# Patient Record
Sex: Male | Born: 1955 | Race: Black or African American | Hispanic: No | Marital: Married | Smoking: Never smoker
Health system: Southern US, Community
[De-identification: ages and names within clinical notes are randomized; demographics above are authoritative.]

## PROBLEM LIST (undated history)

## (undated) DIAGNOSIS — E7800 Pure hypercholesterolemia, unspecified: Secondary | ICD-10-CM

## (undated) DIAGNOSIS — H409 Unspecified glaucoma: Secondary | ICD-10-CM

## (undated) DIAGNOSIS — N186 End stage renal disease: Secondary | ICD-10-CM

## (undated) DIAGNOSIS — I10 Essential (primary) hypertension: Secondary | ICD-10-CM

## (undated) DIAGNOSIS — E119 Type 2 diabetes mellitus without complications: Secondary | ICD-10-CM

## (undated) DIAGNOSIS — D649 Anemia, unspecified: Secondary | ICD-10-CM

## (undated) HISTORY — PX: APPENDECTOMY: SHX54

## (undated) HISTORY — DX: Unspecified glaucoma: H40.9

## (undated) HISTORY — DX: Essential (primary) hypertension: I10

## (undated) HISTORY — DX: Anemia, unspecified: D64.9

## (undated) HISTORY — DX: Type 2 diabetes mellitus without complications: E11.9

## (undated) HISTORY — PX: EYE SURGERY: SHX253

## (undated) HISTORY — PX: PROSTATE BIOPSY: SHX241

---

## 2000-07-11 ENCOUNTER — Emergency Department (HOSPITAL_COMMUNITY): Admission: EM | Admit: 2000-07-11 | Discharge: 2000-07-11 | Payer: Self-pay | Admitting: Emergency Medicine

## 2001-06-20 ENCOUNTER — Emergency Department (HOSPITAL_COMMUNITY): Admission: EM | Admit: 2001-06-20 | Discharge: 2001-06-20 | Payer: Self-pay

## 2002-03-25 ENCOUNTER — Emergency Department (HOSPITAL_COMMUNITY): Admission: EM | Admit: 2002-03-25 | Discharge: 2002-03-25 | Payer: Self-pay

## 2003-04-10 ENCOUNTER — Encounter: Admission: RE | Admit: 2003-04-10 | Discharge: 2003-04-10 | Payer: Self-pay | Admitting: Internal Medicine

## 2003-04-17 ENCOUNTER — Encounter: Admission: RE | Admit: 2003-04-17 | Discharge: 2003-04-17 | Payer: Self-pay | Admitting: Internal Medicine

## 2003-04-24 ENCOUNTER — Encounter: Admission: RE | Admit: 2003-04-24 | Discharge: 2003-07-23 | Payer: Self-pay | Admitting: Internal Medicine

## 2003-11-16 ENCOUNTER — Encounter: Admission: RE | Admit: 2003-11-16 | Discharge: 2003-11-16 | Payer: Self-pay | Admitting: Internal Medicine

## 2005-01-06 ENCOUNTER — Ambulatory Visit: Payer: Self-pay | Admitting: Internal Medicine

## 2005-01-13 ENCOUNTER — Ambulatory Visit: Payer: Self-pay | Admitting: Internal Medicine

## 2005-03-01 ENCOUNTER — Ambulatory Visit: Payer: Self-pay | Admitting: Internal Medicine

## 2005-03-07 ENCOUNTER — Ambulatory Visit: Payer: Self-pay | Admitting: *Deleted

## 2005-06-07 ENCOUNTER — Ambulatory Visit: Payer: Self-pay | Admitting: Internal Medicine

## 2005-06-26 ENCOUNTER — Ambulatory Visit: Payer: Self-pay | Admitting: Internal Medicine

## 2005-12-06 ENCOUNTER — Ambulatory Visit: Payer: Self-pay | Admitting: Internal Medicine

## 2005-12-21 ENCOUNTER — Ambulatory Visit: Payer: Self-pay | Admitting: Internal Medicine

## 2006-01-09 ENCOUNTER — Emergency Department (HOSPITAL_COMMUNITY): Admission: EM | Admit: 2006-01-09 | Discharge: 2006-01-09 | Payer: Self-pay | Admitting: Emergency Medicine

## 2006-01-25 ENCOUNTER — Emergency Department (HOSPITAL_COMMUNITY): Admission: EM | Admit: 2006-01-25 | Discharge: 2006-01-25 | Payer: Self-pay | Admitting: Emergency Medicine

## 2006-01-26 ENCOUNTER — Ambulatory Visit: Payer: Self-pay | Admitting: Family Medicine

## 2006-01-29 ENCOUNTER — Ambulatory Visit: Payer: Self-pay | Admitting: Internal Medicine

## 2006-05-20 ENCOUNTER — Emergency Department (HOSPITAL_COMMUNITY): Admission: EM | Admit: 2006-05-20 | Discharge: 2006-05-20 | Payer: Self-pay | Admitting: Emergency Medicine

## 2006-05-29 LAB — CONVERTED CEMR LAB
Microalb Creat Ratio: 143.88 mg/g
Microalb, Ur: 14.1 mg/dL

## 2006-07-01 ENCOUNTER — Emergency Department (HOSPITAL_COMMUNITY): Admission: EM | Admit: 2006-07-01 | Discharge: 2006-07-01 | Payer: Self-pay | Admitting: Family Medicine

## 2006-08-07 LAB — CONVERTED CEMR LAB
HDL: 40 mg/dL
Triglycerides: 93 mg/dL

## 2007-03-15 LAB — CONVERTED CEMR LAB
Anion Gap: 12.7
BUN: 30 mg/dL
CO2: 28 meq/L
Creatinine, Ser: 1.3 mg/dL
Potassium: 4.7 meq/L

## 2007-05-20 DIAGNOSIS — IMO0001 Reserved for inherently not codable concepts without codable children: Secondary | ICD-10-CM | POA: Insufficient documentation

## 2007-05-20 DIAGNOSIS — E785 Hyperlipidemia, unspecified: Secondary | ICD-10-CM | POA: Insufficient documentation

## 2007-05-20 DIAGNOSIS — E119 Type 2 diabetes mellitus without complications: Secondary | ICD-10-CM

## 2007-05-20 DIAGNOSIS — I10 Essential (primary) hypertension: Secondary | ICD-10-CM

## 2007-06-10 ENCOUNTER — Telehealth (INDEPENDENT_AMBULATORY_CARE_PROVIDER_SITE_OTHER): Payer: Self-pay | Admitting: *Deleted

## 2007-06-14 ENCOUNTER — Ambulatory Visit: Payer: Self-pay | Admitting: Nurse Practitioner

## 2007-06-14 LAB — CONVERTED CEMR LAB
Bilirubin Urine: NEGATIVE
Cholesterol, target level: 200 mg/dL
LDL Goal: 100 mg/dL
Nitrite: NEGATIVE
Urobilinogen, UA: 0.2
pH: 5

## 2007-06-15 ENCOUNTER — Encounter (INDEPENDENT_AMBULATORY_CARE_PROVIDER_SITE_OTHER): Payer: Self-pay | Admitting: Nurse Practitioner

## 2007-06-17 ENCOUNTER — Encounter (INDEPENDENT_AMBULATORY_CARE_PROVIDER_SITE_OTHER): Payer: Self-pay | Admitting: Nurse Practitioner

## 2007-06-17 DIAGNOSIS — D649 Anemia, unspecified: Secondary | ICD-10-CM

## 2007-06-17 LAB — CONVERTED CEMR LAB
Albumin: 4.2 g/dL (ref 3.5–5.2)
Alkaline Phosphatase: 63 units/L (ref 39–117)
BUN: 21 mg/dL (ref 6–23)
CO2: 21 meq/L (ref 19–32)
Calcium: 9 mg/dL (ref 8.4–10.5)
Eosinophils Absolute: 0.1 10*3/uL — ABNORMAL LOW (ref 0.2–0.7)
Glucose, Bld: 250 mg/dL — ABNORMAL HIGH (ref 70–99)
HCT: 38.1 % — ABNORMAL LOW (ref 39.0–52.0)
Lymphs Abs: 2 10*3/uL (ref 0.7–4.0)
MCV: 85.4 fL (ref 78.0–100.0)
Microalb, Ur: 12 mg/dL — ABNORMAL HIGH (ref 0.00–1.89)
Monocytes Relative: 4 % (ref 3–12)
Neutrophils Relative %: 54 % (ref 43–77)
PSA: 1.14 ng/mL (ref 0.10–4.00)
Potassium: 4.1 meq/L (ref 3.5–5.3)
RBC: 4.46 M/uL (ref 4.22–5.81)
TSH: 1.01 microintl units/mL (ref 0.350–5.50)
WBC: 5 10*3/uL (ref 4.0–10.5)

## 2007-07-05 ENCOUNTER — Telehealth (INDEPENDENT_AMBULATORY_CARE_PROVIDER_SITE_OTHER): Payer: Self-pay | Admitting: Nurse Practitioner

## 2007-07-15 ENCOUNTER — Ambulatory Visit: Payer: Self-pay | Admitting: Nurse Practitioner

## 2007-07-15 DIAGNOSIS — R809 Proteinuria, unspecified: Secondary | ICD-10-CM | POA: Insufficient documentation

## 2007-07-15 DIAGNOSIS — F528 Other sexual dysfunction not due to a substance or known physiological condition: Secondary | ICD-10-CM

## 2007-07-15 LAB — CONVERTED CEMR LAB: Blood Glucose, Fingerstick: 274

## 2007-07-16 ENCOUNTER — Encounter (INDEPENDENT_AMBULATORY_CARE_PROVIDER_SITE_OTHER): Payer: Self-pay | Admitting: Nurse Practitioner

## 2007-07-18 ENCOUNTER — Ambulatory Visit: Payer: Self-pay | Admitting: Nurse Practitioner

## 2007-07-19 ENCOUNTER — Encounter (INDEPENDENT_AMBULATORY_CARE_PROVIDER_SITE_OTHER): Payer: Self-pay | Admitting: Nurse Practitioner

## 2007-07-19 LAB — CONVERTED CEMR LAB
Collection Interval-CRCL: 24 hr
Creatinine 24 HR UR: 1526 mg/24hr (ref 800–2000)
Creatinine Clearance: 73 mL/min — ABNORMAL LOW (ref 75–125)
Creatinine, Urine: 69.4 mg/dL
Protein, Ur: 682 mg/24hr — ABNORMAL HIGH (ref 50–100)

## 2007-07-23 ENCOUNTER — Encounter (INDEPENDENT_AMBULATORY_CARE_PROVIDER_SITE_OTHER): Payer: Self-pay | Admitting: Nurse Practitioner

## 2007-09-10 ENCOUNTER — Telehealth (INDEPENDENT_AMBULATORY_CARE_PROVIDER_SITE_OTHER): Payer: Self-pay | Admitting: Nurse Practitioner

## 2007-09-11 ENCOUNTER — Inpatient Hospital Stay (HOSPITAL_COMMUNITY): Admission: EM | Admit: 2007-09-11 | Discharge: 2007-09-18 | Payer: Self-pay | Admitting: Emergency Medicine

## 2007-09-11 ENCOUNTER — Emergency Department (HOSPITAL_COMMUNITY): Admission: EM | Admit: 2007-09-11 | Discharge: 2007-09-11 | Payer: Self-pay | Admitting: Family Medicine

## 2007-09-11 ENCOUNTER — Encounter (INDEPENDENT_AMBULATORY_CARE_PROVIDER_SITE_OTHER): Payer: Self-pay | Admitting: Nurse Practitioner

## 2007-09-11 ENCOUNTER — Encounter (INDEPENDENT_AMBULATORY_CARE_PROVIDER_SITE_OTHER): Payer: Self-pay | Admitting: Surgery

## 2007-09-11 DIAGNOSIS — Z9089 Acquired absence of other organs: Secondary | ICD-10-CM | POA: Insufficient documentation

## 2007-12-12 ENCOUNTER — Ambulatory Visit: Payer: Self-pay | Admitting: Nurse Practitioner

## 2008-01-02 ENCOUNTER — Telehealth (INDEPENDENT_AMBULATORY_CARE_PROVIDER_SITE_OTHER): Payer: Self-pay | Admitting: Nurse Practitioner

## 2008-01-06 ENCOUNTER — Ambulatory Visit: Payer: Self-pay | Admitting: Nurse Practitioner

## 2008-01-07 ENCOUNTER — Ambulatory Visit: Payer: Self-pay | Admitting: Nurse Practitioner

## 2008-01-08 ENCOUNTER — Ambulatory Visit: Payer: Self-pay | Admitting: Nurse Practitioner

## 2008-01-08 ENCOUNTER — Encounter (INDEPENDENT_AMBULATORY_CARE_PROVIDER_SITE_OTHER): Payer: Self-pay | Admitting: *Deleted

## 2008-10-26 ENCOUNTER — Encounter (INDEPENDENT_AMBULATORY_CARE_PROVIDER_SITE_OTHER): Payer: Self-pay | Admitting: Nurse Practitioner

## 2008-11-30 ENCOUNTER — Ambulatory Visit: Payer: Self-pay | Admitting: Nurse Practitioner

## 2008-12-13 ENCOUNTER — Telehealth (INDEPENDENT_AMBULATORY_CARE_PROVIDER_SITE_OTHER): Payer: Self-pay | Admitting: Family Medicine

## 2008-12-13 LAB — CONVERTED CEMR LAB
Albumin: 3.8 g/dL (ref 3.5–5.2)
BUN: 30 mg/dL — ABNORMAL HIGH (ref 6–23)
CO2: 19 meq/L (ref 19–32)
Calcium: 8.8 mg/dL (ref 8.4–10.5)
Chloride: 100 meq/L (ref 96–112)
Cholesterol: 209 mg/dL — ABNORMAL HIGH (ref 0–200)
Creatinine, Ser: 1.76 mg/dL — ABNORMAL HIGH (ref 0.40–1.50)
HDL: 39 mg/dL — ABNORMAL LOW (ref >39)
Potassium: 4.5 meq/L (ref 3.5–5.3)
Total CHOL/HDL Ratio: 5.4

## 2008-12-31 ENCOUNTER — Ambulatory Visit: Payer: Self-pay | Admitting: Nurse Practitioner

## 2008-12-31 LAB — CONVERTED CEMR LAB: Blood Glucose, Fingerstick: 112

## 2009-01-13 ENCOUNTER — Ambulatory Visit: Payer: Self-pay | Admitting: Internal Medicine

## 2009-01-13 ENCOUNTER — Encounter (INDEPENDENT_AMBULATORY_CARE_PROVIDER_SITE_OTHER): Payer: Self-pay | Admitting: Nurse Practitioner

## 2009-01-18 ENCOUNTER — Encounter (INDEPENDENT_AMBULATORY_CARE_PROVIDER_SITE_OTHER): Payer: Self-pay | Admitting: Nurse Practitioner

## 2009-01-19 ENCOUNTER — Ambulatory Visit: Payer: Self-pay | Admitting: Nurse Practitioner

## 2009-04-22 ENCOUNTER — Telehealth (INDEPENDENT_AMBULATORY_CARE_PROVIDER_SITE_OTHER): Payer: Self-pay | Admitting: Nurse Practitioner

## 2009-07-24 ENCOUNTER — Emergency Department (HOSPITAL_COMMUNITY): Admission: EM | Admit: 2009-07-24 | Discharge: 2009-07-24 | Payer: Self-pay | Admitting: Emergency Medicine

## 2009-07-29 ENCOUNTER — Ambulatory Visit: Payer: Self-pay | Admitting: Nurse Practitioner

## 2009-07-29 LAB — CONVERTED CEMR LAB
Bilirubin Urine: NEGATIVE
Blood Glucose, Fingerstick: 142
Glucose, Urine, Semiquant: NEGATIVE
Protein, U semiquant: >=300
Rapid HIV Screen: NEGATIVE
Specific Gravity, Urine: 1.025

## 2009-08-03 ENCOUNTER — Telehealth (INDEPENDENT_AMBULATORY_CARE_PROVIDER_SITE_OTHER): Payer: Self-pay | Admitting: *Deleted

## 2009-08-03 ENCOUNTER — Encounter (INDEPENDENT_AMBULATORY_CARE_PROVIDER_SITE_OTHER): Payer: Self-pay | Admitting: Nurse Practitioner

## 2009-08-03 LAB — CONVERTED CEMR LAB
ALT: 45 units/L (ref 0–53)
AST: 25 units/L (ref 0–37)
Albumin: 3.2 g/dL — ABNORMAL LOW (ref 3.5–5.2)
Alkaline Phosphatase: 123 units/L — ABNORMAL HIGH (ref 39–117)
Basophils Absolute: 0 10*3/uL (ref 0.0–0.1)
Calcium: 8.2 mg/dL — ABNORMAL LOW (ref 8.4–10.5)
Chloride: 107 meq/L (ref 96–112)
Eosinophils Relative: 2 % (ref 0–5)
HCT: 29.3 % — ABNORMAL LOW (ref 39.0–52.0)
Lymphocytes Relative: 40 % (ref 12–46)
Lymphs Abs: 1.9 10*3/uL (ref 0.7–4.0)
Neutro Abs: 2.3 10*3/uL (ref 1.7–7.7)
PSA: 1.14 ng/mL (ref 0.10–4.00)
Platelets: 179 10*3/uL (ref 150–400)
Potassium: 5 meq/L (ref 3.5–5.3)
RDW: 14.6 % (ref 11.5–15.5)
Sodium: 138 meq/L (ref 135–145)
Total Protein: 6.3 g/dL (ref 6.0–8.3)
WBC: 4.8 10*3/uL (ref 4.0–10.5)

## 2009-08-04 ENCOUNTER — Encounter (INDEPENDENT_AMBULATORY_CARE_PROVIDER_SITE_OTHER): Payer: Self-pay | Admitting: Nurse Practitioner

## 2009-08-10 ENCOUNTER — Ambulatory Visit: Payer: Self-pay | Admitting: Nurse Practitioner

## 2009-08-11 ENCOUNTER — Encounter (INDEPENDENT_AMBULATORY_CARE_PROVIDER_SITE_OTHER): Payer: Self-pay | Admitting: *Deleted

## 2009-08-11 ENCOUNTER — Encounter (INDEPENDENT_AMBULATORY_CARE_PROVIDER_SITE_OTHER): Payer: Self-pay | Admitting: Nurse Practitioner

## 2009-08-11 LAB — CONVERTED CEMR LAB
BUN: 25 mg/dL — ABNORMAL HIGH (ref 6–23)
CO2: 23 meq/L (ref 19–32)
Chloride: 102 meq/L (ref 96–112)
Creatinine 24 HR UR: 2565 mg/24hr — ABNORMAL HIGH (ref 800–2000)
Creatinine, Ser: 2.78 mg/dL — ABNORMAL HIGH (ref 0.40–1.50)
Potassium: 4.9 meq/L (ref 3.5–5.3)

## 2009-08-19 ENCOUNTER — Ambulatory Visit: Payer: Self-pay | Admitting: Nurse Practitioner

## 2009-08-19 DIAGNOSIS — N189 Chronic kidney disease, unspecified: Secondary | ICD-10-CM

## 2010-06-16 ENCOUNTER — Inpatient Hospital Stay (HOSPITAL_COMMUNITY)
Admission: EM | Admit: 2010-06-16 | Discharge: 2010-06-20 | Payer: Self-pay | Source: Home / Self Care | Admitting: Emergency Medicine

## 2010-06-16 ENCOUNTER — Emergency Department (HOSPITAL_COMMUNITY): Admission: EM | Admit: 2010-06-16 | Discharge: 2010-06-16 | Payer: Self-pay | Admitting: Family Medicine

## 2010-06-16 ENCOUNTER — Ambulatory Visit: Payer: Self-pay | Admitting: Cardiology

## 2010-06-17 ENCOUNTER — Encounter (INDEPENDENT_AMBULATORY_CARE_PROVIDER_SITE_OTHER): Payer: Self-pay | Admitting: Internal Medicine

## 2010-08-30 NOTE — Progress Notes (Signed)
Summary: form  Phone Note Call from Patient   Summary of Call: [name] TO BE FILLED OUT/SAID Charles [name] [place] Initial call taken by: Roland Earl,  August 03, 2009 11:07 AM  Follow-up for Phone Call        forward to provider, form not in your office, Adela Lank does not have, pt insist he brought the forms and you would have them ready today. Follow-up by: Bridgett Larsson RN,  August 03, 2009 12:37 PM  Additional Follow-up for Phone Call Additional follow up Details #1::        I have the form - i thought he wanted this office to mail it in on his behalf.   It is on bottom shelf in office. Make a copy for chart and pt to get original  Very important - his diabetes is uncontrolled and is causing his kidneys to work hard. He needs to increase his insulin to 30 units nightly. He needs 24 urine collection for protein and crt and BMP with return. Also add anemia panel to blood in lab he needs an office visit in 4 weeks  Additional Follow-up by: Aurora Mask FNP,  August 03, 2009 2:03 PM    Additional Follow-up for Phone Call Additional follow up Details #2::    CALLED PT TO LET HIM KNOW FORM WAS READY ,BUT HE SAID HE PICKED IT UP TUES.HAS TO BRING BACK 24 HR URINE, SO HE WILL ASK FOR FORM THAT WE HAVE Follow-up by: Roland Earl,  August 04, 2009 12:24 PM  New/Updated Medications: LEVEMIR FLEXPEN 100 UNIT/ML SOLN (INSULIN DETEMIR) Take 30 units Subcutaneously at bedtime

## 2010-08-30 NOTE — Letter (Signed)
Summary: *[facility] Results Follow up  [place], [place] [address]   Phone: [phone]  Fax: [phone]      08/11/2009   [place], [place]  [address]   Dear  [name],                            ____S.Drinkard,FNP   ____D. Gore,FNP       ____B. McPherson,MD   ____V. Rankins,MD    ____E. Mulberry,MD    ____N. Hassell Done, FNP  ____D. Jobe Igo, MD    ____K. Tomma Lightning, MD    ____Other     This letter is to inform you that your recent test(s):  _______Pap Smear    _______Lab Test     _______X-ray    _______ is within acceptable limits  _______ requires a medication change  _______ requires a follow-up lab visit  ___X____ requires a follow-up visit with your provider   Comments:  We tried reaching you at [phone].  Please contact the office at your earliest convenience to schedule a follow-up appointment with your provider.       _________________________________________________________ If you have any questions, please contact our office                     Sincerely,  Isla Pence [facility]

## 2010-08-30 NOTE — Letter (Signed)
Summary: [employer] [facility]  [facility] [employer]   Imported By: Roland Earl 10/11/2009 14:42:39  _____________________________________________________________________  External Attachment:    Type:   Image     Comment:   External Document

## 2010-08-30 NOTE — Letter (Signed)
Summary: [employer]  [employer]   Imported By: Roland Earl 08/04/2009 10:29:21  _____________________________________________________________________  External Attachment:    Type:   Image     Comment:   External Document

## 2010-08-30 NOTE — Assessment & Plan Note (Signed)
Summary: Renal insufficiency   Vital Signs:  Patient profile:   55 year old male Weight:      189.1 pounds BMI:     27.23 BSA:     2.04 Temp:     98.2 degrees F oral Pulse rate:   73 / minute Pulse rhythm:   regular Resp:     16 per minute BP sitting:   175 / 101  (left arm) Cuff size:   regular  Vitals Entered By: Isla Pence (August 19, 2009 9:28 AM) CC: follow-up visit for lab results, Hypertension Management, Lipid Management Is Patient Diabetic? Yes Pain Assessment Patient in pain? no      CBG Result 116 CBG Device ID B  Does patient need assistance? Functional Status Self care Ambulation Normal   CC:  follow-up visit for lab results, Hypertension Management, and Lipid Management.  History of Present Illness:  Pt into the office for review of labs. Pt completed a 24 hr urine during his last visit.  Diabetes - Pt has spoken with a dietician about his diet but he is from [place] and likes his african cruisine. Admits that he takes meds as ordered and has it here with him today.        Diabetes Management History:      The patient is a 55 years old male who comes in for evaluation of Type 2 Diabetes Mellitus.  He has not been enrolled in the "Diabetic Education Program".  He states understanding of dietary principles but he is not following the appropriate diet.  No sensory loss is reported.  Self foot exams are not being performed.  He is not checking home blood sugars.  He says that he is not exercising regularly.        Reported hypoglycemic symptoms include weakness.  Frequency of hypoglycemic symptoms are reported to be seldom.        The following changes have been made to his treatment plan since last visit: insulin dosing.  Treatment plan changes were initiated by MD.    Hypertension History:      He denies headache, chest pain, and palpitations.  He notes no problems with any antihypertensive medication side effects.  Taking meds as ordered but did  not take any meds yet today.        Positive major cardiovascular risk factors include male age 78 years old or older, diabetes, hyperlipidemia, and hypertension.  Negative major cardiovascular risk factors include non-tobacco-user status.        Positive history for target organ damage include renal insufficiency.  Further assessment for target organ damage reveals no history of ASHD, cardiac end-organ damage (CHF/LVH), stroke/TIA, peripheral vascular disease, or hypertensive retinopathy.    Lipid Management History:      Positive NCEP/ATP III risk factors include male age 4 years old or older, diabetes, HDL cholesterol less than 40, and hypertension.  Negative NCEP/ATP III risk factors include non-tobacco-user status, no ASHD (atherosclerotic heart disease), no prior stroke/TIA, no peripheral vascular disease, and no history of aortic aneurysm.        The patient states that he knows about the "Therapeutic Lifestyle Change" diet.  His compliance with the TLC diet is fair.  The patient does not know about adjunctive measures for cholesterol lowering.  Adjunctive measures started by the patient include ASA.  He expresses no side effects from his lipid-lowering medication.  The patient denies any symptoms to suggest myopathy or liver disease.      Habits &  Providers  Alcohol-Tobacco-Diet     Alcohol drinks/day: 0     Tobacco Status: never  Exercise-Depression-Behavior     Does Patient Exercise: no     Have you felt down or hopeless? no     Have you felt little pleasure in things? no     Depression Counseling: not indicated; screening negative for depression     Drug Use: no  Current Medications (verified): 1)  Simvastatin 20 Mg  Tabs (Simvastatin) .Marland Kitchen.. 1 Tablet By Mouth At Night For Cholesterol 2)  Actos 30 Mg  Tabs (Pioglitazone Hcl) .Marland Kitchen.. 1 Tablet By Mouth Daily For Blood Sugar 3)  Glipizide Xl 10 Mg  Tb24 (Glipizide) .Marland Kitchen.. 1 Tablet By Mouth Daily For Blood Sugar 4)  Atenolol 50 Mg  Tabs  (Atenolol) .Marland Kitchen.. 1 Tablet By Mouth Two Times A Day For Blood Pressure 5)  Aspirin Ec Low Dose 81 Mg  Tbec (Aspirin) .Marland Kitchen.. 1 Tablet By Mouth Daily 6)  Levemir Flexpen 100 Unit/ml Soln (Insulin Detemir) .... Take 30 Units Subcutaneously At Bedtime 7)  Enalapril Maleate 20 Mg Tabs (Enalapril Maleate) .... One Tablet By Mouth Daily For Blood Pressure **note Change in Dose**  Allergies (verified): No Known Drug Allergies  Review of Systems CV:  Denies chest pain or discomfort. Resp:  Denies cough. GI:  Denies abdominal pain, nausea, and vomiting. Endo:  Denies excessive thirst and excessive urination.  Physical Exam  General:  alert.   Head:  normocephalic.   Lungs:  normal breath sounds.   Heart:  normal rate and regular rhythm.   Msk:  normal ROM.   Neurologic:  alert & oriented X3.   Skin:  color normal.   Psych:  Oriented X3.    Diabetes Management Exam:    Foot Exam (with socks and/or shoes not present):       Sensory-Monofilament:          Left foot: normal          Right foot: normal   Impression & Recommendations:  Problem # 1:  DIABETES MELLITUS, TYPE II (ICD-250.00) Metformin D/c'd due to kidney problems pt to increase dose of levemir to 30 advised pt to eat at regular intervals during the day to avoid hypoglycemia The following medications were removed from the medication list:    Metformin Hcl 1000 Mg Tabs (Metformin hcl) .Marland Kitchen... 1 tablet by mouth two times a day for blood sugar    Enalapril Maleate 10 Mg Tabs (Enalapril maleate) .Marland Kitchen... 1 tablet by mouth daily for blood pressure His updated medication list for this problem includes:    Actos 30 Mg Tabs (Pioglitazone hcl) .Marland Kitchen... 1 tablet by mouth daily for blood sugar    Glipizide Xl 10 Mg Tb24 (Glipizide) .Marland Kitchen... 1 tablet by mouth daily for blood sugar    Aspirin Ec Low Dose 81 Mg Tbec (Aspirin) .Marland Kitchen... 1 tablet by mouth daily    Levemir Flexpen 100 Unit/ml Soln (Insulin detemir) .Marland Kitchen... Take 30 units subcutaneously at  bedtime    Enalapril Maleate 20 Mg Tabs (Enalapril maleate) ..... One tablet by mouth daily for blood pressure **note change in dose**  Orders: Capillary Blood Glucose/CBG RC:[id number])  Problem # 2:  HYPERTENSION (ICD-401.9) uncontrolled will increase enalapril to 20mg  by mouth daily  continue atenolol DASH diet The following medications were removed from the medication list:    Enalapril Maleate 10 Mg Tabs (Enalapril maleate) .Marland Kitchen... 1 tablet by mouth daily for blood pressure His updated medication list for this problem includes:  Atenolol 50 Mg Tabs (Atenolol) .Marland Kitchen... 1 tablet by mouth two times a day for blood pressure    Enalapril Maleate 20 Mg Tabs (Enalapril maleate) ..... One tablet by mouth daily for blood pressure **note change in dose**  Problem # 3:  HYPERLIPIDEMIA (ICD-272.4) will check at next visit His updated medication list for this problem includes:    Simvastatin 20 Mg Tabs (Simvastatin) .Marland Kitchen... 1 tablet by mouth at night for cholesterol  Problem # 4:  RENAL INSUFFICIENCY, ACUTE (ICD-585.9) reviewed labs with pt this is of great concern needs better control of blood sugar and blood pressure  Complete Medication List: 1)  Simvastatin 20 Mg Tabs (Simvastatin) .Marland Kitchen.. 1 tablet by mouth at night for cholesterol 2)  Actos 30 Mg Tabs (Pioglitazone hcl) .Marland Kitchen.. 1 tablet by mouth daily for blood sugar 3)  Glipizide Xl 10 Mg Tb24 (Glipizide) .Marland Kitchen.. 1 tablet by mouth daily for blood sugar 4)  Atenolol 50 Mg Tabs (Atenolol) .Marland Kitchen.. 1 tablet by mouth two times a day for blood pressure 5)  Aspirin Ec Low Dose 81 Mg Tbec (Aspirin) .Marland Kitchen.. 1 tablet by mouth daily 6)  Levemir Flexpen 100 Unit/ml Soln (Insulin detemir) .... Take 30 units subcutaneously at bedtime 7)  Enalapril Maleate 20 Mg Tabs (Enalapril maleate) .... One tablet by mouth daily for blood pressure **note change in dose**  Diabetes Management Assessment/Plan:      The following lipid goals have been established for the patient: Total  cholesterol goal of 200; LDL cholesterol goal of 100; HDL cholesterol goal of 40; Triglyceride goal of 150.  His blood pressure goal is < 125/75.    Hypertension Assessment/Plan:      The patient's hypertensive risk group is category C: Target organ damage and/or diabetes.  His calculated 10 year risk of coronary heart disease is 27 %.  Today's blood pressure is 175/101.  His blood pressure goal is < 125/75.  Lipid Assessment/Plan:      Based on NCEP/ATP III, the patient's risk factor category is "history of diabetes".  The patient's lipid goals are as follows: Total cholesterol goal is 200; LDL cholesterol goal is 100; HDL cholesterol goal is 40; Triglyceride goal is 150.  His LDL cholesterol goal has not been met.  He has been counseled on adjunctive measures for lowering his cholesterol and has been provided with dietary instructions.    Patient Instructions: 1)  Your kidneys are of great concern.  They are working hard now due to uncontrolled diabetes and blood pressure. 2)  Check your blood sugar daily before breakfast and then before dinner. 3)  Write these results down. Bring with you to the next office visit. 4)  StOP the metformin. 5)  Levemir 30 units nightly 6)  Really monitor your diet.  Eat three times a day, no necessarily heavy meals but fruit will do. 7)  Continue to take your medications daily. 8)  Follow up with n.martin, fnp in 1 month for diabetes. Come fasting for labs. 9)  will need CMP, lipids,  and u/a, foot check Prescriptions: ASPIRIN EC LOW DOSE 81 MG  TBEC (ASPIRIN) 1 tablet by mouth daily  #30 x 5   Entered and Authorized by:   Aurora Mask FNP   Signed by:   Aurora Mask FNP on 08/19/2009   Method used:   Faxed to ...       [facility] (retail)       [address]  [place], [place]  [address]       Ph: [phone]       Fax: [phone]   RxID:   [id number] LEVEMIR FLEXPEN 100 UNIT/ML SOLN (INSULIN  DETEMIR) Take 30 units Subcutaneously at bedtime  #1 month qs x 5   Entered and Authorized by:   Aurora Mask FNP   Signed by:   Aurora Mask FNP on 08/19/2009   Method used:   Faxed to ...       [facility] (retail)       [place], [place]  [address]       Ph: [phone]       Fax: [phone]   RxID:   [id number] ENALAPRIL MALEATE 20 MG TABS (ENALAPRIL MALEATE) One tablet by mouth daily for blood pressure **note change in dose**  #30 x 5   Entered and Authorized by:   Aurora Mask FNP   Signed by:   Aurora Mask FNP on 08/19/2009   Method used:   Faxed to ...       [facility] (retail)       [place], [place]  [address]       Ph: [phone]       Fax: [phone]   RxID:   [id number] ATENOLOL 50 MG  TABS (ATENOLOL) 1 tablet by mouth two times a day for blood pressure  #90 x 0   Entered and Authorized by:   Aurora Mask FNP   Signed by:   Aurora Mask FNP on 08/19/2009   Method used:   Faxed to ...       [facility] (retail)       [place], [place]  [address]       Ph: [phone]       Fax: [phone]   RxID:   [id number] GLIPIZIDE XL 10 MG  TB24 (GLIPIZIDE) 1 tablet by mouth daily for blood sugar  #30 x 5   Entered and Authorized by:   Aurora Mask FNP   Signed by:   Aurora Mask FNP on 08/19/2009   Method used:   Faxed to ...       [facility] (retail)       [place], [place]  [address]       Ph: [phone]       Fax: [phone]   RxID:   [id number] ACTOS 30 MG  TABS (PIOGLITAZONE HCL) 1 tablet by mouth daily for blood sugar  #30 x 5   Entered and Authorized by:   Aurora Mask FNP   Signed by:   Aurora Mask FNP on 08/19/2009   Method used:   Faxed to ...       [facility] (retail)       [place], [place]  [address]       Ph: [phone]       Fax: [phone]   RxID:   [id number] SIMVASTATIN 20 MG  TABS (SIMVASTATIN) 1 tablet by mouth at night for cholesterol  #30 x 5   Entered and Authorized by:   Aurora Mask FNP   Signed by:   Aurora Mask FNP on 08/19/2009  Method used:   Faxed to ...       [facility] (retail)       [place], [place]  [address]       Ph: [phone] [place]       Fax: [phone]   RxID:   [id number]   Last LDL:                                                 108 (11/30/2008 10:15:00 PM)        Diabetic Foot Exam Last Podiatry Exam Date: 07/29/2009    10-g (5.07) Semmes-Weinstein Monofilament Test Performed by: Isla Pence          Right Foot          Left Foot Visual Inspection               Test Control      normal         normal Site 1         normal         normal Site 2         normal         normal Site 3         normal         normal Site 4         normal         normal Site 5         normal         normal Site 6         normal         normal Site 7         normal         normal Site 8         normal         normal Site 9         normal         normal Site 10         normal         normal  Impression      normal         normal

## 2010-10-11 LAB — HEPATITIS C ANTIBODY: HCV Ab: NEGATIVE

## 2010-10-11 LAB — UIFE/LIGHT CHAINS/TP QN, 24-HR UR
Albumin, U: DETECTED
Alpha 1, Urine: DETECTED — AB
Beta, Urine: DETECTED — AB
Free Lambda Lt Chains,Ur: 1.61 mg/dL — ABNORMAL HIGH (ref 0.08–1.01)
Gamma Globulin, Urine: DETECTED — AB
Total Protein, Urine-Ur/day: 3509 mg/d — ABNORMAL HIGH (ref 10–140)
Total Protein, Urine: 146.2 mg/dL

## 2010-10-11 LAB — RENAL FUNCTION PANEL
Albumin: 2.8 g/dL — ABNORMAL LOW (ref 3.5–5.2)
Albumin: 3.1 g/dL — ABNORMAL LOW (ref 3.5–5.2)
Chloride: 108 mEq/L (ref 96–112)
Chloride: 111 mEq/L (ref 96–112)
GFR calc Af Amer: 27 mL/min — ABNORMAL LOW (ref >60)
GFR calc non Af Amer: 22 mL/min — ABNORMAL LOW (ref >60)
GFR calc non Af Amer: 24 mL/min — ABNORMAL LOW (ref >60)
Phosphorus: 4.6 mg/dL (ref 2.3–4.6)
Potassium: 3.9 mEq/L (ref 3.5–5.1)
Potassium: 4.1 mEq/L (ref 3.5–5.1)
Sodium: 140 mEq/L (ref 135–145)

## 2010-10-11 LAB — URINALYSIS, ROUTINE W REFLEX MICROSCOPIC
Bilirubin Urine: NEGATIVE
Ketones, ur: NEGATIVE mg/dL
Nitrite: NEGATIVE
Protein, ur: >300 mg/dL — AB
Urobilinogen, UA: 0.2 mg/dL (ref 0.0–1.0)

## 2010-10-11 LAB — SICKLE CELL SCREEN: Sickle Cell Screen: NEGATIVE

## 2010-10-11 LAB — ANA: Anti Nuclear Antibody(ANA): NEGATIVE

## 2010-10-11 LAB — BASIC METABOLIC PANEL
BUN: 29 mg/dL — ABNORMAL HIGH (ref 6–23)
Calcium: 8.9 mg/dL (ref 8.4–10.5)
Chloride: 109 mEq/L (ref 96–112)
GFR calc Af Amer: 27 mL/min — ABNORMAL LOW (ref >60)
GFR calc non Af Amer: 23 mL/min — ABNORMAL LOW (ref >60)
Glucose, Bld: 116 mg/dL — ABNORMAL HIGH (ref 70–99)
Glucose, Bld: 309 mg/dL — ABNORMAL HIGH (ref 70–99)
Potassium: 4.2 mEq/L (ref 3.5–5.1)
Potassium: 4.7 mEq/L (ref 3.5–5.1)
Sodium: 134 mEq/L — ABNORMAL LOW (ref 135–145)

## 2010-10-11 LAB — CBC
HCT: 40.7 % (ref 39.0–52.0)
Hemoglobin: 13.8 g/dL (ref 13.0–17.0)
MCHC: 33.9 g/dL (ref 30.0–36.0)
Platelets: 229 10*3/uL (ref 150–400)
RBC: 4.08 MIL/uL — ABNORMAL LOW (ref 4.22–5.81)
RDW: 12.7 % (ref 11.5–15.5)
RDW: 13 % (ref 11.5–15.5)
WBC: 4.2 10*3/uL (ref 4.0–10.5)
WBC: 6 10*3/uL (ref 4.0–10.5)

## 2010-10-11 LAB — PROTEIN ELECTROPH W RFLX QUANT IMMUNOGLOBULINS
Albumin ELP: 53 % — ABNORMAL LOW (ref 55.8–66.1)
Beta 2: 4.8 % (ref 3.2–6.5)
Gamma Globulin: 18.4 % (ref 11.1–18.8)

## 2010-10-11 LAB — HEPATITIS B SURFACE ANTIGEN: Hepatitis B Surface Ag: NEGATIVE

## 2010-10-11 LAB — GLUCOSE, CAPILLARY
Glucose-Capillary: 135 mg/dL — ABNORMAL HIGH (ref 70–99)
Glucose-Capillary: 141 mg/dL — ABNORMAL HIGH (ref 70–99)
Glucose-Capillary: 168 mg/dL — ABNORMAL HIGH (ref 70–99)
Glucose-Capillary: 180 mg/dL — ABNORMAL HIGH (ref 70–99)
Glucose-Capillary: 188 mg/dL — ABNORMAL HIGH (ref 70–99)
Glucose-Capillary: 197 mg/dL — ABNORMAL HIGH (ref 70–99)
Glucose-Capillary: 215 mg/dL — ABNORMAL HIGH (ref 70–99)

## 2010-10-11 LAB — CARDIAC PANEL(CRET KIN+CKTOT+MB+TROPI)
CK, MB: 2 ng/mL (ref 0.3–4.0)
CK, MB: 3.3 ng/mL (ref 0.3–4.0)
Relative Index: 0.5 (ref 0.0–2.5)
Relative Index: 0.8 (ref 0.0–2.5)
Total CK: 400 U/L — ABNORMAL HIGH (ref 7–232)
Troponin I: 0.01 ng/mL (ref 0.00–0.06)

## 2010-10-11 LAB — DIFFERENTIAL
Basophils Absolute: 0 10*3/uL (ref 0.0–0.1)
Basophils Relative: 0 % (ref 0–1)
Lymphocytes Relative: 48 % — ABNORMAL HIGH (ref 12–46)
Monocytes Relative: 4 % (ref 3–12)
Neutro Abs: 2.6 10*3/uL (ref 1.7–7.7)
Neutrophils Relative %: 44 % (ref 43–77)

## 2010-10-11 LAB — POCT I-STAT, CHEM 8
BUN: 42 mg/dL — ABNORMAL HIGH (ref 6–23)
Chloride: 111 mEq/L (ref 96–112)
HCT: 43 % (ref 39.0–52.0)
Potassium: 4.2 mEq/L (ref 3.5–5.1)

## 2010-10-11 LAB — POCT CARDIAC MARKERS
CKMB, poc: 3 ng/mL (ref 1.0–8.0)
Troponin i, poc: <0.05 ng/mL (ref 0.00–0.09)

## 2010-10-11 LAB — CREATININE, URINE, RANDOM: Creatinine, Urine: 58.3 mg/dL

## 2010-10-11 LAB — CK TOTAL AND CKMB (NOT AT ARMC): Relative Index: 0.8 (ref 0.0–2.5)

## 2010-10-11 LAB — FERRITIN: Ferritin: 112 ng/mL (ref 22–322)

## 2010-10-11 LAB — IRON AND TIBC: Iron: 138 ug/dL — ABNORMAL HIGH (ref 42–135)

## 2010-10-11 LAB — TROPONIN I: Troponin I: 0.02 ng/mL (ref 0.00–0.06)

## 2010-10-11 LAB — ANTI-NEUTROPHIL ANTIBODY

## 2010-10-11 LAB — HEMOGLOBIN A1C: Mean Plasma Glucose: 200 mg/dL — ABNORMAL HIGH (ref <117)

## 2010-12-13 NOTE — Discharge Summary (Signed)
NAMELORNE, MEDLEY NO.:  [id number]   MEDICAL RECORD NO.:  [id number]          PATIENT TYPE:  INP   LOCATION:  [id number]                         FACILITY:  [facility]   PHYSICIAN:  Earnstine Regal, MD      DATE OF BIRTH:  [birth year]   DATE OF ADMISSION:  09/11/2007  DATE OF DISCHARGE:  09/18/2007                               DISCHARGE SUMMARY   REASON FOR ADMISSION:  Acute appendicitis.   HISTORY OF PRESENT ILLNESS:  The patient is a 55 year old black male  from [place], [place] who presented to the emergency  department with greater than a 24-hour history of abdominal pain  localized to the right lower quadrant.  The patient was seen and  evaluated in the emergency department.  He was found to have an elevated  white blood cell count with a left shift.  A CT scan of the abdomen and  pelvis showed signs of acute appendicitis.  The patient was seen by  general surgery and prepared, and taken to the operating room.   HOSPITAL COURSE:  At operation the patient was found to have perforated  appendicitis.  He required open laparotomy.  Appendectomy was performed.  Postoperative course required intravenous antibiotics.  The patient  developed a superficial wound infection which required opening up his  abdominal wound and packing it with wet-to-dry dressings.  He had  gradual resolution of his ileus, and was advanced to a regular diet.  White blood cell count normalized on antibiotic therapy.  The patient  was prepared for discharge home on the seventh postoperative day.   DISCHARGE/PLANNING:  The patient was discharged home, today, September 18, 2007 in good condition, tolerating a regular diet, and ambulating  independently.  The patient will be seen back in my office in 5 days for  a wound check and staple removal.  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   DISCHARGE MEDICATIONS INCLUDE:  Vicodin as needed for pain and Augmentin  875 mg b.i.d. for 1 week.   CONDITION AT DISCHARGE:  Perforated acute appendicitis.   CONDITION ON DISCHARGE:  Improved.      Earnstine Regal, MD  Electronically Signed     TMG/MEDQ  D:  09/18/2007  T:  09/18/2007  Job:  [id number]

## 2010-12-13 NOTE — Op Note (Signed)
NAMEZAIVEN, OSTERHOUDT NO.:  [id number]   MEDICAL RECORD NO.:  [id number]          PATIENT TYPE:  INP   LOCATION:  68                         FACILITY:  [facility]   PHYSICIAN:  Earnstine Regal, MD      DATE OF BIRTH:  03/20/1956   DATE OF PROCEDURE:  09/11/2007  DATE OF DISCHARGE:                               OPERATIVE REPORT   PREOPERATIVE DIAGNOSIS:  Acute appendicitis.   POSTOPERATIVE DIAGNOSIS:  Acute appendicitis with perforation and  abscess.   PROCEDURES:  1. Attempted laparoscopic appendectomy.  2. Open appendectomy.   SURGEON:  Earnstine Regal, MD, FACS   ANESTHESIA:  General per Dr. Freddie Apley.   ESTIMATED BLOOD LOSS:  25 mL.   PREPARATION:  Betadine.   COMPLICATIONS:  None.   INDICATIONS:  Patient is 55 year old [place] male who presents to the  emergency department with a 24-hour history of abdominal pain localized  to the right lower quadrant.  The patient was noted to be febrile.  White count was elevated 11.7 with a left shift.  Physical exam was  consistent with acute appendicitis.  The patient is now prepared and  brought to the operating room.   DESCRIPTION OF PROCEDURE:  Procedure was done in OR #1 at [facility]  The patient is brought to the operating room,  placed in supine position on the operating room table.  Following  administration of general anesthesia, the patient is prepped and draped  in the usual strict aseptic fashion.  After ascertaining that an  adequate level of anesthesia been obtained, a supraumbilical incision is  made with a #15 blade.  Dissection is carried down to the fascia.  The  peritoneal cavity is entered through the umbilical hernia defect.  Zero  Vicryl pursestring suture is placed in the defect and a Hasson cannula  is introduced and secured with a pursestring suture.  Abdomen is  insufflated with carbon dioxide.  Laparoscope is introduced.  There is a  large phlegmon in the  right lower quadrant.  A 5 mm operating port is  placed in the right upper quadrant.  Omentum is mobilized off the cecum  and a large amount of green purulent fluid emanates from the right lower  quadrant.  With gentle blunt dissection, the small bowel is mobilized  away from the inflammatory mass.  The base of the cecum is visualized.  However, there is a dense inflammatory mass adherent to the abdominal  sidewall and I am unable to positively identify the appendix safely.   Therefore, the decision is made to convert to open surgery.  After  changing instruments, a transverse incision is made and the right lower  quadrant over the base of the cecum.  Dissection is carried through the  abdominal wall and the peritoneal cavity is entered cautiously.  Using  gentle blunt dissection the cecum is mobilized.  The appendix is  identified.  Adhesions are taken down with the Harmonic scalpel.  Mesoappendix is divided with the Harmonic scalpel.  Appendix is  mobilized until the base of the appendix is visualized.  This  is cleared  circumferentially.  Base of the appendix is then transected at its  junction with the cecal wall with an Endo-GIA stapler.  The appendix  clearly has an area of perforation.  It is submitted to pathology for  review.  Right lower quadrant is copiously irrigated with warm saline.  Interloop abscesses are broken up and evacuated.  Saline is used to  irrigate copiously around the base of the cecum.  Omentum is then pulled  over the base of the cecum.  Abdominal wall is then closed in three  layers with running 0-0 Vicryl sutures.  Subcutaneous tissues are  irrigated.  Skin is closed loosely with stainless steel staples.  The 5  mm port site is closed with staples.  The umbilical wound, after removal  of the camera and all ports and evacuation of pneumoperitoneum, is  closed with interrupted #1 Prolene sutures.  Skin is closed with  stainless steel staples.  Wounds are  washed and dried and sterile  dressings are applied.  The patient is awakened from anesthesia and  brought to the recovery room in stable condition.  The patient tolerated  the procedure well.      Earnstine Regal, MD  Electronically Signed     TMG/MEDQ  D:  09/11/2007  T:  09/13/2007  Job:  [id number]   cc:   Myriam Jacobson  Fax: [phone]

## 2010-12-13 NOTE — H&P (Signed)
NAMEJCEON, HARGENS NO.:  [id number]   MEDICAL RECORD NO.:  [id number]          PATIENT TYPE:  INP   LOCATION:  13                         FACILITY:  [facility]   PHYSICIAN:  Earnstine Regal, MD      DATE OF BIRTH:  1955-08-11   DATE OF ADMISSION:  09/11/2007  DATE OF DISCHARGE:                              HISTORY & PHYSICAL   REFERRING PHYSICIAN:  Dr. Dorie Rank.   REASON FOR ADMISSION:  Acute appendicitis.   HISTORY OF PRESENT ILLNESS:  The patient is a 55 year old black male who  presents to the emergency department with a 24-hour history of abdominal  pain localizing to the right lower quadrant.  This was associated with  fever and chills.  The patient notes no bowel movement in the past 2  days.  He ate a small amount of food at approximately 2:00 p.m. today.  He is unable to walk upright.  He has significant pain when the car hits  the curbing.  He presents with his wife to the emergency department for  assessment.   PAST MEDICAL HISTORY:  Diabetes mellitus.   MEDICATIONS:  Actos, Glucophage.   ALLERGIES:  None known.   SOCIAL HISTORY:  The patient is married.  He is accompanied by his wife.  He works as a Special educational needs teacher. He has three children.  He quit smoking cigarettes  10 years ago.  He denies alcohol use.  He denies drug use.   FAMILY HISTORY:  Noncontributory.   A 15 system review otherwise negative.   PHYSICAL EXAMINATION:  A 55 year old, well-developed, well-nourished  black male in no acute distress.  Temperature 101.2, pulse 88, respirations 18, blood pressure 147/74.  HEENT:  Normocephalic, atraumatic.  Sclerae clear.  Conjunctiva clear.  Pupils equal and reactive.  Dentition fair.  Mucous membranes moist.  Voice normal.  Palpation of the neck shows it to be soft, nontender.  Thyroid is normal without nodularity.  No lymphadenopathy.  LUNGS:  Clear to auscultation bilaterally.  CARDIAC:  Regular rate and rhythm without murmur.  Peripheral pulses  are  full.  ABDOMEN: Slightly protuberant.  There is an umbilical hernia which is  reducible and asymptomatic.  Palpation reveals diffuse abdominal  tenderness, maximum in the right lower quadrant.  There is voluntary  guarding.  There is rebound tenderness.  There is no sign of hernia.  There are no surgical wounds.  EXTREMITIES:  Nontender without edema.  NEUROLOGIC:  The patient is alert and oriented without focal deficit.   LABORATORY STUDIES:  White count 11.7, hemoglobin 13.7, hematocrit  40.1%.  Differential shows 92% segmented neutrophils, 5% lymphocytes,  platelet count was normal at 203,000.  Urinalysis shows glucose greater  than 1000, moderate hemoglobin.  Chemistry profile shows an elevated  glucose of 324.   RADIOGRAPHIC STUDIES:  CT scan abdomen and pelvis suspicious for acute  appendicitis with inflammatory changes right lower quadrant of the  abdomen.   IMPRESSION:  Acute appendicitis.   PLAN:  The patient will be admitted on the general surgical service.  He  will be prepared and taken to the operating room  for diagnostic  laparoscopy and hopefully laparoscopic cholecystectomy.  The risk and  benefits of the procedure were discussed with the patient and his wife.  The possibility of conversion to open surgery was noted.  The  possibility of alternative diagnosis was also noted. They understand and  wish to proceed.      Earnstine Regal, MD  Electronically Signed     TMG/MEDQ  D:  09/11/2007  T:  09/13/2007  Job:  [id number]   cc:   Myriam Jacobson  Fax: [phone]

## 2011-04-21 LAB — URINALYSIS, ROUTINE W REFLEX MICROSCOPIC
Bilirubin Urine: NEGATIVE
Ketones, ur: NEGATIVE
Nitrite: NEGATIVE
Protein, ur: 100 — AB
Specific Gravity, Urine: 1.029
Urobilinogen, UA: 0.2

## 2011-04-21 LAB — CBC
HCT: 40.1
HCT: 25.6 — ABNORMAL LOW
HCT: 27.6 — ABNORMAL LOW
Hemoglobin: 9 — ABNORMAL LOW
MCHC: 34.7
MCHC: 35
MCV: 86.1
MCV: 85.3
MCV: 85.7
Platelets: 232
Platelets: 379
RBC: 4.65
RBC: 2.96 — ABNORMAL LOW
RBC: 3.01 — ABNORMAL LOW
RDW: 14
RDW: 14
RDW: 14.2
WBC: 11.7 — ABNORMAL HIGH
WBC: 12.8 — ABNORMAL HIGH

## 2011-04-21 LAB — BASIC METABOLIC PANEL
CO2: 25
Chloride: 105
Creatinine, Ser: 1.28
GFR calc Af Amer: >60
GFR calc non Af Amer: 60 — ABNORMAL LOW
Sodium: 140

## 2011-04-21 LAB — LIPASE, BLOOD: Lipase: 19

## 2011-04-21 LAB — DIFFERENTIAL
Basophils Absolute: 0
Basophils Absolute: 0
Basophils Relative: 0
Eosinophils Absolute: 0
Eosinophils Absolute: 0.3
Eosinophils Relative: 0
Eosinophils Relative: 3
Lymphocytes Relative: 5 — ABNORMAL LOW
Lymphocytes Relative: 11 — ABNORMAL LOW

## 2011-04-21 LAB — COMPREHENSIVE METABOLIC PANEL
AST: 28
Alkaline Phosphatase: 62
BUN: 17
CO2: 26
Chloride: 101
Creatinine, Ser: 1.62 — ABNORMAL HIGH
GFR calc Af Amer: 55 — ABNORMAL LOW
GFR calc non Af Amer: 46 — ABNORMAL LOW
Total Bilirubin: 1.3 — ABNORMAL HIGH

## 2012-08-29 ENCOUNTER — Emergency Department (HOSPITAL_COMMUNITY)
Admission: EM | Admit: 2012-08-29 | Discharge: 2012-08-29 | Disposition: A | Discharge status: Home / Self Care | Payer: No Typology Code available for payment source | Source: Home / Self Care | Attending: Family Medicine | Admitting: Family Medicine

## 2012-08-29 DIAGNOSIS — E119 Type 2 diabetes mellitus without complications: Secondary | ICD-10-CM

## 2012-08-29 DIAGNOSIS — E785 Hyperlipidemia, unspecified: Secondary | ICD-10-CM

## 2012-08-29 DIAGNOSIS — D649 Anemia, unspecified: Secondary | ICD-10-CM

## 2012-08-29 DIAGNOSIS — I10 Essential (primary) hypertension: Secondary | ICD-10-CM

## 2012-08-29 LAB — CBC
Hemoglobin: 13.4 g/dL (ref 13.0–17.0)
RBC: 4.68 MIL/uL (ref 4.22–5.81)
WBC: 5.6 10*3/uL (ref 4.0–10.5)

## 2012-08-29 LAB — COMPREHENSIVE METABOLIC PANEL
ALT: 26 U/L (ref 0–53)
Alkaline Phosphatase: 123 U/L — ABNORMAL HIGH (ref 39–117)
CO2: 24 mEq/L (ref 19–32)
Chloride: 101 mEq/L (ref 96–112)
GFR calc Af Amer: 18 mL/min — ABNORMAL LOW (ref >90)
GFR calc non Af Amer: 15 mL/min — ABNORMAL LOW (ref >90)
Glucose, Bld: 296 mg/dL — ABNORMAL HIGH (ref 70–99)
Potassium: 3.9 mEq/L (ref 3.5–5.1)
Sodium: 135 mEq/L (ref 135–145)

## 2012-08-29 LAB — LIPID PANEL
LDL Cholesterol: 119 mg/dL — ABNORMAL HIGH (ref 0–99)
Total CHOL/HDL Ratio: 4.2 RATIO
Triglycerides: 193 mg/dL — ABNORMAL HIGH (ref <150)
VLDL: 39 mg/dL (ref 0–40)

## 2012-08-29 MED ORDER — CLONIDINE HCL 0.1 MG PO TABS
ORAL_TABLET | ORAL | Status: AC
  Filled 2012-08-29: qty 2

## 2012-08-29 MED ORDER — CLONIDINE HCL 0.1 MG PO TABS
0.2000 mg | ORAL_TABLET | Freq: Once | ORAL | Status: AC
  Administered 2012-08-29: 0.2 mg via ORAL

## 2012-08-29 MED ORDER — INSULIN DETEMIR 100 UNIT/ML ~~LOC~~ SOLN: 30.0000 [IU] | Freq: Every day | SUBCUTANEOUS | Status: DC

## 2012-08-29 MED ORDER — LABETALOL HCL 300 MG PO TABS: 300.0000 mg | ORAL_TABLET | Freq: Two times a day (BID) | ORAL | Status: DC

## 2012-08-29 MED ORDER — HYDRALAZINE HCL 50 MG PO TABS: 50.0000 mg | ORAL_TABLET | Freq: Three times a day (TID) | ORAL | Status: DC

## 2012-08-29 NOTE — ED Provider Notes (Signed)
History    CSN: [id number]  Arrival date & time 08/29/12  1727   First MD Initiated Contact with Patient 08/29/12 1753     CC:  Refill medications for BP  HPI Patient presents today to establish with the clinic.  He has no medical insurance.  He has malignant hypertension that has been difficult to control and it has affected his kidneys profoundly.  He has chronic kidney disease.  He has followed a nephrologist but not recently.  He reports that he was told that his kidneys were stable.  He has not been taking his medications recently because he had run out of his prescription.  He reports that he's not taking his medications as prescribed because he became tired after taking some of the beta blockers and would only take hydralazine 3 times daily after it was prescribed 4 times a day.  The patient reports that he did the best that he could but he's had a difficult time controlling his hypertension.  He is also out of his Levemir insulin which he takes for diabetes mellitus.  He reports that he takes 30 units daily.  He does not have any of that medication available.  past medical history Hypertension Chronic Kidney Disease Anemia   No past surgical history noncontributory  family history  Hypertension Both parents deceased His father lived to be over 43 years old   History  Substance Use Topics  . Smoking status: Not on file  . Smokeless tobacco: Not on file  . Alcohol Use: Not on file    Review of Systems  Constitutional: Positive for fatigue.  HENT: Positive for congestion.   Musculoskeletal: Positive for arthralgias.  Neurological: Positive for headaches.  All other systems reviewed and are negative.   Allergies  No known drug allergies  Home Medications  Hydralazine 25 mg by mouth 3 times a day Levemir insulin flex pen 30 units daily Labetalol 200 mg by mouth twice a day   Temp 98.6 Pulse 85 R-14, BP 195/103, pulse ox 100% on RA  Physical Exam  Nursing note and  vitals reviewed. Constitutional: He is oriented to person, place, and time. He appears well-developed and well-nourished. No distress.  HENT:  Head: Normocephalic and atraumatic.  Right Ear: External ear normal.  Left Ear: External ear normal.  Nose: Nose normal.  Eyes: Conjunctivae normal and EOM are normal. Pupils are equal, round, and reactive to light. No scleral icterus.       Muddy sclera   Neck: Normal range of motion. Neck supple.  Cardiovascular: Normal rate, regular rhythm and normal heart sounds.   Pulmonary/Chest: Effort normal and breath sounds normal.  Abdominal: Soft. Bowel sounds are normal. He exhibits no distension and no mass. There is no tenderness. There is no rebound and no guarding.  Musculoskeletal: Normal range of motion. He exhibits no edema and no tenderness.  Neurological: He is alert and oriented to person, place, and time. He has normal reflexes. He exhibits normal muscle tone. Coordination normal.  Skin: Skin is warm and dry. No rash noted. No erythema. No pallor.  Psychiatric: He has a normal mood and affect. His behavior is normal. Judgment and thought content normal.    ED Course  Procedures (including critical care time)  Labs Reviewed  CBC  COMPREHENSIVE METABOLIC PANEL  HEMOGLOBIN A1C  LIPID PANEL  VITAMIN D 25 HYDROXY  TSH   No results found.  No diagnosis found.  MDM  IMPRESSION  Malignant hypertension  Diabetes mellitus  uncontrolled  Chronic kidney disease  RECOMMENDATIONS / PLAN Check labs today Pt declined flu vaccine Clonidine 0.2 mg po given today, rechecked BP and it was 161/105 Refilled BP meds today Increased hydralazine dose today to 50 mg po TID Increasing labetalol 300 mg po BID Counseled patient extensively regarding low sodium diet and renal diet.  I'm concerned that his kidney function has worsened.  He has not been followed closely as I would have expected.  He will likely need a referral to a nephrologist for  ongoing followup care. The patient does not want to lose his kidneys and end up on dialysis.  He is headed in that direction based on what I am finding here today.  We'll followup on his lab results and make a determination. The patient reports that he is going to seek assistance with the [facility] Department pharmacy for his Levemir prescription  FOLLOW UP 2 weeks for BP check  The patient was given clear instructions to go to ER or return to medical center if symptoms don't improve, worsen or new problems develop.  The patient verbalized understanding.  The patient was told to call to get lab results if they haven't heard anything in the next week.            Murlean Iba, MD 08/29/12 1924

## 2012-08-30 LAB — TSH: TSH: 1.122 u[IU]/mL (ref 0.350–4.500)

## 2012-08-30 NOTE — Progress Notes (Signed)
Quick Note:  Please call patient and notify him that his kidneys are getting worse. His creatinine has increased from 2.95 to 4.05. His diabetes is out of control as evidenced by his A1c of >12%. His blood pressure is poorly controlled. The patient needs to get an appointment to see his nephrologist as soon as possible. Please call and make him an appointment with Dr. Jimmy Footman as soon as possible to be evaluated for worsening chronic kidney disease ([facility]).   Gerlene Fee, MD, CDE, [place], [place]   ______

## 2012-08-30 NOTE — Progress Notes (Signed)
I called [facility] to attempt to make an appointment for patient to be seen by his nephrologist Dr. Jimmy Footman.  They told me that the appointment specialist was away from her desk but she will call our office back to make his appointment.     Gerlene Fee, MD, CDE, [place], [place]

## 2013-03-12 ENCOUNTER — Other Ambulatory Visit: Payer: Self-pay | Admitting: *Deleted

## 2013-03-12 DIAGNOSIS — N184 Chronic kidney disease, stage 4 (severe): Secondary | ICD-10-CM

## 2013-03-12 DIAGNOSIS — Z01810 Encounter for preprocedural cardiovascular examination: Secondary | ICD-10-CM

## 2013-03-24 ENCOUNTER — Encounter: Payer: Self-pay | Admitting: Vascular Surgery

## 2013-03-25 ENCOUNTER — Ambulatory Visit: Payer: Self-pay | Admitting: Vascular Surgery

## 2013-05-02 ENCOUNTER — Other Ambulatory Visit: Payer: Self-pay | Admitting: Vascular Surgery

## 2013-05-02 DIAGNOSIS — N184 Chronic kidney disease, stage 4 (severe): Secondary | ICD-10-CM

## 2013-05-02 DIAGNOSIS — Z01810 Encounter for preprocedural cardiovascular examination: Secondary | ICD-10-CM

## 2013-05-05 ENCOUNTER — Encounter: Payer: Self-pay | Admitting: Vascular Surgery

## 2013-05-06 ENCOUNTER — Ambulatory Visit (INDEPENDENT_AMBULATORY_CARE_PROVIDER_SITE_OTHER): Payer: BC Managed Care – PPO | Admitting: Vascular Surgery

## 2013-05-06 ENCOUNTER — Ambulatory Visit (HOSPITAL_COMMUNITY)
Admission: RE | Admit: 2013-05-06 | Discharge: 2013-05-06 | Disposition: A | Discharge status: Home / Self Care | Payer: BC Managed Care – PPO | Source: Ambulatory Visit | Attending: Vascular Surgery | Admitting: Vascular Surgery

## 2013-05-06 ENCOUNTER — Encounter: Payer: Self-pay | Admitting: Vascular Surgery

## 2013-05-06 VITALS — BP 176/104 | HR 86 | Resp 18 | Ht 71.0 in | Wt 199.5 lb

## 2013-05-06 DIAGNOSIS — N184 Chronic kidney disease, stage 4 (severe): Secondary | ICD-10-CM | POA: Insufficient documentation

## 2013-05-06 DIAGNOSIS — Z01810 Encounter for preprocedural cardiovascular examination: Secondary | ICD-10-CM | POA: Insufficient documentation

## 2013-05-06 DIAGNOSIS — N179 Acute kidney failure, unspecified: Secondary | ICD-10-CM | POA: Insufficient documentation

## 2013-05-06 NOTE — Progress Notes (Signed)
Subjective:     Patient ID: Charles [name], male   DOB: Dec 21, 1955, 57 y.o.   MRN: [id number]  HPI this 57 year old male was referred for evaluation for vascular access. He has no history of being on hemodialysis. He is right-handed. He does have type 2 diabetes mellitus. He denies any history of coronary artery disease.  Past Medical History  Diagnosis Date  . Hypertension   . Diabetes mellitus without complication     History  Substance Use Topics  . Smoking status: Never Smoker   . Smokeless tobacco: Never Used  . Alcohol Use: No    History reviewed. No pertinent family history.  Allergies not on file  Current outpatient prescriptions:hydrALAZINE (APRESOLINE) 50 MG tablet, Take 1 tablet (50 mg total) by mouth 3 (three) times daily., Disp: 90 tablet, Rfl: 3;  insulin detemir (LEVEMIR FLEXPEN) 100 UNIT/ML injection, Inject 30 Units into the skin at bedtime., Disp: 10 mL, Rfl: 5;  labetalol (NORMODYNE) 300 MG tablet, Take 1 tablet (300 mg total) by mouth 2 (two) times daily., Disp: 60 tablet, Rfl: 3  BP 176/104  Pulse 86  Resp 18  Ht 5\' 11"  (1.803 m)  Wt 199 lb 8 oz (90.493 kg)  BMI 27.84 kg/m2  Body mass index is 27.84 kg/(m^2).          Review of Systems denies chest pain, dyspnea on exertion, PND, orthopnea, hemoptysis. Other systems negative and complete review of systems    Objective:   Physical Exam BP 176/104  Pulse 86  Resp 18  Ht 5\' 11"  (1.803 m)  Wt 199 lb 8 oz (90.493 kg)  BMI 27.84 kg/m2  Gen.-alert and oriented x3 in no apparent distress HEENT normal for age Lungs no rhonchi or wheezing Cardiovascular regular rhythm no murmurs carotid pulses 3+ palpable no bruits audible Abdomen soft nontender no palpable masses Musculoskeletal free of  major deformities Skin clear -no rashes Neurologic normal Lower extremities 3+ femoral and dorsalis pedis pulses palpable bilaterally with no edema  Today I ordered a bilateral upper extremity vein mapping  which I reviewed and interpreted. Patient has small veins bilaterally. The largest cephalic vein is in the right upper arm. Both basilic veins are adequate in size but do connect to the deep system in the upper third of the arm bilaterally. I confirmed these findings with the SonoSite.     Assessment:     Chronic renal insufficiency not yet on hemodialysis needs vascular access-patient has generally small veins    Plan:     Best plan would be to attempt right brachial-cephalic AV fistula even though cephalic vein is somewhat borderline in size. If this is not satisfactory we'll then need to go to basilic vein transposition probably in left upper extremity initially Will schedule that for Monday, October 20 for Dr. Bridgett Larsson Possibility of failure of fistula and need for further surgery was discussed with patient he understands

## 2013-05-08 ENCOUNTER — Other Ambulatory Visit: Payer: Self-pay

## 2013-05-16 ENCOUNTER — Encounter (HOSPITAL_COMMUNITY)
Admission: RE | Admit: 2013-05-16 | Discharge: 2013-05-16 | Disposition: A | Discharge status: Home / Self Care | Payer: BC Managed Care – PPO | Source: Ambulatory Visit | Attending: Vascular Surgery | Admitting: Vascular Surgery

## 2013-05-16 ENCOUNTER — Ambulatory Visit (HOSPITAL_COMMUNITY)
Admission: RE | Admit: 2013-05-16 | Discharge: 2013-05-16 | Disposition: A | Discharge status: Home / Self Care | Payer: BC Managed Care – PPO | Source: Ambulatory Visit | Attending: Anesthesiology | Admitting: Anesthesiology

## 2013-05-16 ENCOUNTER — Encounter (HOSPITAL_COMMUNITY): Payer: Self-pay

## 2013-05-16 DIAGNOSIS — R9431 Abnormal electrocardiogram [ECG] [EKG]: Secondary | ICD-10-CM | POA: Insufficient documentation

## 2013-05-16 DIAGNOSIS — Z01810 Encounter for preprocedural cardiovascular examination: Secondary | ICD-10-CM | POA: Insufficient documentation

## 2013-05-16 DIAGNOSIS — Z01818 Encounter for other preprocedural examination: Secondary | ICD-10-CM | POA: Insufficient documentation

## 2013-05-16 DIAGNOSIS — Z01812 Encounter for preprocedural laboratory examination: Secondary | ICD-10-CM | POA: Insufficient documentation

## 2013-05-16 DIAGNOSIS — E119 Type 2 diabetes mellitus without complications: Secondary | ICD-10-CM | POA: Insufficient documentation

## 2013-05-16 DIAGNOSIS — I10 Essential (primary) hypertension: Secondary | ICD-10-CM | POA: Insufficient documentation

## 2013-05-16 HISTORY — DX: End stage renal disease: N18.6

## 2013-05-16 NOTE — Progress Notes (Signed)
05/16/13 1023  OBSTRUCTIVE SLEEP APNEA  Have you ever been diagnosed with sleep apnea through a sleep study? No  Do you snore loudly (loud enough to be heard through closed doors)?  1  Do you often feel tired, fatigued, or sleepy during the daytime? 0  Has anyone observed you stop breathing during your sleep? 0  Do you have, or are you being treated for high blood pressure? 1  BMI more than 35 kg/m2? 0  Age over 55 years old? 1  Neck circumference greater than 40 cm/18 inches? 0  Gender: 1  Obstructive Sleep Apnea Score 4  Score 4 or greater  Results sent to PCP

## 2013-05-16 NOTE — Progress Notes (Addendum)
Spoke with pt regarding change in surgery time; pt advised to still report at 5:30 AM for a 8:30 AM start.

## 2013-05-16 NOTE — Pre-Procedure Instructions (Signed)
Charles [name]  05/16/2013   Your procedure is scheduled on:  05/19/13  Report to [facility] short stay admitting at 530 AM.  Call this number if you have problems the morning of surgery: [phone]   Remember:   Do not eat food or drink liquids after midnight.   Take these medicines the morning of surgery with A SIP OF WATER: labatelol,hydralazine   Do not wear jewelry, make-up or nail polish.  Do not wear lotions, powders, or perfumes. You may wear deodorant.  Do not shave 48 hours prior to surgery. Men may shave face and neck.  Do not bring valuables to the hospital.  [facility] is not responsible                  for any belongings or valuables.               Contacts, dentures or bridgework may not be worn into surgery.  Leave suitcase in the car. After surgery it may be brought to your room.  For patients admitted to the hospital, discharge time is determined by your                treatment team.               Patients discharged the day of surgery will not be allowed to drive  home.  Name and phone number of your driver:   Special Instructions: Shower using CHG 2 nights before surgery and the night before surgery.  If you shower the day of surgery use CHG.  Use special wash - you have one bottle of CHG for all showers.  You should use approximately 1/3 of the bottle for each shower.   Please read over the following fact sheets that you were given: Pain Booklet, Coughing and Deep Breathing and Surgical Site Infection Prevention

## 2013-05-16 NOTE — Progress Notes (Signed)
Patient reports that he has been out of his BP medication for ~ 1 week d/t not being able to afford the copay of ~ $80. BP as noted. Spoke with Ebony Hail. Spoke with nurse at [facility] they will look into same and call patient if they have an alternative. Left message for Bethena Roys at [facility].

## 2013-05-18 MED ORDER — DEXTROSE 5 % IV SOLN
1.5000 g | INTRAVENOUS | Status: AC
  Administered 2013-05-19: 1.5 g via INTRAVENOUS
  Filled 2013-05-18: qty 1.5

## 2013-05-19 ENCOUNTER — Encounter (HOSPITAL_COMMUNITY)
Admission: RE | Disposition: A | Discharge status: Home / Self Care | Payer: Self-pay | Source: Ambulatory Visit | Attending: Vascular Surgery

## 2013-05-19 ENCOUNTER — Ambulatory Visit (HOSPITAL_COMMUNITY)
Admission: RE | Admit: 2013-05-19 | Discharge: 2013-05-19 | Disposition: A | Discharge status: Home / Self Care | Payer: BC Managed Care – PPO | Source: Ambulatory Visit | Attending: Vascular Surgery | Admitting: Vascular Surgery

## 2013-05-19 ENCOUNTER — Telehealth: Payer: Self-pay | Admitting: Vascular Surgery

## 2013-05-19 ENCOUNTER — Encounter (HOSPITAL_COMMUNITY): Payer: BC Managed Care – PPO | Admitting: Anesthesiology

## 2013-05-19 ENCOUNTER — Ambulatory Visit (HOSPITAL_COMMUNITY): Payer: BC Managed Care – PPO | Admitting: Anesthesiology

## 2013-05-19 ENCOUNTER — Encounter (HOSPITAL_COMMUNITY): Payer: Self-pay | Admitting: *Deleted

## 2013-05-19 DIAGNOSIS — I129 Hypertensive chronic kidney disease with stage 1 through stage 4 chronic kidney disease, or unspecified chronic kidney disease: Secondary | ICD-10-CM | POA: Insufficient documentation

## 2013-05-19 DIAGNOSIS — N189 Chronic kidney disease, unspecified: Secondary | ICD-10-CM

## 2013-05-19 DIAGNOSIS — Z794 Long term (current) use of insulin: Secondary | ICD-10-CM | POA: Insufficient documentation

## 2013-05-19 DIAGNOSIS — N183 Chronic kidney disease, stage 3 unspecified: Secondary | ICD-10-CM

## 2013-05-19 DIAGNOSIS — N184 Chronic kidney disease, stage 4 (severe): Secondary | ICD-10-CM | POA: Insufficient documentation

## 2013-05-19 DIAGNOSIS — E119 Type 2 diabetes mellitus without complications: Secondary | ICD-10-CM | POA: Insufficient documentation

## 2013-05-19 HISTORY — DX: Pure hypercholesterolemia, unspecified: E78.00

## 2013-05-19 HISTORY — PX: AV FISTULA PLACEMENT: SHX1204

## 2013-05-19 LAB — POCT I-STAT 4, (NA,K, GLUC, HGB,HCT)
Glucose, Bld: 155 mg/dL — ABNORMAL HIGH (ref 70–99)
HCT: 32 % — ABNORMAL LOW (ref 39.0–52.0)
Hemoglobin: 10.9 g/dL — ABNORMAL LOW (ref 13.0–17.0)

## 2013-05-19 SURGERY — ARTERIOVENOUS (AV) FISTULA CREATION
Anesthesia: Monitor Anesthesia Care | Site: Arm Upper | Laterality: Right | Wound class: Clean

## 2013-05-19 MED ORDER — OXYCODONE HCL 5 MG PO TABS: 5.0000 mg | ORAL_TABLET | ORAL | Status: DC | PRN

## 2013-05-19 MED ORDER — 0.9 % SODIUM CHLORIDE (POUR BTL) OPTIME
TOPICAL | Status: DC | PRN
  Administered 2013-05-19: 1000 mL

## 2013-05-19 MED ORDER — FENTANYL CITRATE 0.05 MG/ML IJ SOLN
INTRAMUSCULAR | Status: DC | PRN
  Administered 2013-05-19 (×4): 25 ug via INTRAVENOUS

## 2013-05-19 MED ORDER — THROMBIN 20000 UNITS EX SOLR
CUTANEOUS | Status: AC
  Filled 2013-05-19: qty 20000

## 2013-05-19 MED ORDER — LIDOCAINE-EPINEPHRINE (PF) 1 %-1:200000 IJ SOLN
INTRAMUSCULAR | Status: DC | PRN
  Administered 2013-05-19: 30 mL

## 2013-05-19 MED ORDER — SODIUM CHLORIDE 0.9 % IV SOLN
INTRAVENOUS | Status: DC | PRN
  Administered 2013-05-19: 08:00:00 via INTRAVENOUS

## 2013-05-19 MED ORDER — SODIUM CHLORIDE 0.9 % IV SOLN
INTRAVENOUS | Status: DC
  Administered 2013-05-19: 07:00:00 via INTRAVENOUS

## 2013-05-19 MED ORDER — MIDAZOLAM HCL 5 MG/5ML IJ SOLN
INTRAMUSCULAR | Status: DC | PRN
  Administered 2013-05-19 (×2): 1 mg via INTRAVENOUS

## 2013-05-19 MED ORDER — BUPIVACAINE HCL (PF) 0.5 % IJ SOLN
INTRAMUSCULAR | Status: DC | PRN
  Administered 2013-05-19: 30 mL

## 2013-05-19 MED ORDER — PROPOFOL INFUSION 10 MG/ML OPTIME
INTRAVENOUS | Status: DC | PRN
  Administered 2013-05-19: 50 ug/kg/min via INTRAVENOUS

## 2013-05-19 MED ORDER — LIDOCAINE-EPINEPHRINE (PF) 1 %-1:200000 IJ SOLN
INTRAMUSCULAR | Status: AC
  Filled 2013-05-19: qty 10

## 2013-05-19 MED ORDER — SODIUM CHLORIDE 0.9 % IR SOLN
Status: DC | PRN
  Administered 2013-05-19: 08:00:00

## 2013-05-19 SURGICAL SUPPLY — 41 items
ARMBAND PINK RESTRICT EXTREMIT (MISCELLANEOUS) ×2 IMPLANT
CANISTER SUCTION 2500CC (MISCELLANEOUS) ×2 IMPLANT
CLIP TI MEDIUM 6 (CLIP) ×2 IMPLANT
CLIP TI WIDE RED SMALL 6 (CLIP) ×2 IMPLANT
COVER PROBE W GEL 5X96 (DRAPES) ×2 IMPLANT
COVER SURGICAL LIGHT HANDLE (MISCELLANEOUS) ×2 IMPLANT
DECANTER SPIKE VIAL GLASS SM (MISCELLANEOUS) ×2 IMPLANT
DERMABOND ADVANCED (GAUZE/BANDAGES/DRESSINGS) ×1
DERMABOND ADVANCED .7 DNX12 (GAUZE/BANDAGES/DRESSINGS) ×1 IMPLANT
ELECT REM PT RETURN 9FT ADLT (ELECTROSURGICAL) ×2
ELECTRODE REM PT RTRN 9FT ADLT (ELECTROSURGICAL) ×1 IMPLANT
GLOVE BIO SURGEON STRL SZ7 (GLOVE) ×2 IMPLANT
GLOVE BIO SURGEON STRL SZ7.5 (GLOVE) ×2 IMPLANT
GLOVE BIOGEL PI IND STRL 6.5 (GLOVE) ×1 IMPLANT
GLOVE BIOGEL PI IND STRL 7.0 (GLOVE) ×1 IMPLANT
GLOVE BIOGEL PI IND STRL 7.5 (GLOVE) ×1 IMPLANT
GLOVE BIOGEL PI INDICATOR 6.5 (GLOVE) ×1
GLOVE BIOGEL PI INDICATOR 7.0 (GLOVE) ×1
GLOVE BIOGEL PI INDICATOR 7.5 (GLOVE) ×1
GLOVE ECLIPSE 7.5 STRL STRAW (GLOVE) ×2 IMPLANT
GLOVE SS BIOGEL STRL SZ 6.5 (GLOVE) ×1 IMPLANT
GLOVE SUPERSENSE BIOGEL SZ 6.5 (GLOVE) ×1
GLOVE SURG SS PI 6.0 STRL IVOR (GLOVE) ×2 IMPLANT
GLOVE SURG SS PI 7.5 STRL IVOR (GLOVE) ×2 IMPLANT
GOWN STRL NON-REIN LRG LVL3 (GOWN DISPOSABLE) ×8 IMPLANT
KIT BASIN OR (CUSTOM PROCEDURE TRAY) ×2 IMPLANT
KIT ROOM TURNOVER OR (KITS) ×2 IMPLANT
NEEDLE HYPO 25GX1X1/2 BEV (NEEDLE) ×2 IMPLANT
NS IRRIG 1000ML POUR BTL (IV SOLUTION) ×2 IMPLANT
PACK CV ACCESS (CUSTOM PROCEDURE TRAY) ×2 IMPLANT
PAD ARMBOARD 7.5X6 YLW CONV (MISCELLANEOUS) ×4 IMPLANT
SPONGE SURGIFOAM ABS GEL 100 (HEMOSTASIS) IMPLANT
SUT MNCRL AB 4-0 PS2 18 (SUTURE) ×2 IMPLANT
SUT PROLENE 6 0 BV (SUTURE) ×2 IMPLANT
SUT PROLENE 7 0 BV 1 (SUTURE) ×2 IMPLANT
SUT VIC AB 3-0 SH 27 (SUTURE) ×1
SUT VIC AB 3-0 SH 27X BRD (SUTURE) ×1 IMPLANT
TOWEL OR 17X24 6PK STRL BLUE (TOWEL DISPOSABLE) ×2 IMPLANT
TOWEL OR 17X26 10 PK STRL BLUE (TOWEL DISPOSABLE) ×2 IMPLANT
UNDERPAD 30X30 INCONTINENT (UNDERPADS AND DIAPERS) ×2 IMPLANT
WATER STERILE IRR 1000ML POUR (IV SOLUTION) ×2 IMPLANT

## 2013-05-19 NOTE — Anesthesia Postprocedure Evaluation (Signed)
  Anesthesia Post-op Note  Patient: Charles [name]  Procedure(s) Performed: Procedure(s): RIGHT BRACHIOCEPHALIC  ARTERIOVENOUS (AV) FISTULA CREATION (Right)  Patient Location: PACU  Anesthesia Type:General  Level of Consciousness: awake  Airway and Oxygen Therapy: Patient Spontanous Breathing  Post-op Pain: mild  Post-op Assessment: Post-op Vital signs reviewed  Post-op Vital Signs: Reviewed  Complications: No apparent anesthesia complications

## 2013-05-19 NOTE — Preoperative (Signed)
Beta Blockers   Reason not to administer Beta Blockers:Not Applicable 

## 2013-05-19 NOTE — Op Note (Signed)
OPERATIVE NOTE   PROCEDURE: right brachiocephalic arteriovenous fistula placement  PRE-OPERATIVE DIAGNOSIS: chronic kidney disease stage III-IV   POST-OPERATIVE DIAGNOSIS: same as above   SURGEON: Adele Barthel, MD  ASSISTANT(S): Wray Kearns, PAC   ANESTHESIA: local and MAC  ESTIMATED BLOOD LOSS: 30 cc  FINDING(S): 1.  Weakly palpable thrill 2.  Dopplerable right radial signal  SPECIMEN(S):  none  INDICATIONS:   Charles [name] is a 57 y.o. male who presents with chronic kidney disease stage III-IV.  The patient is scheduled for right brachiocephalic arteriovenous fistula placement.  The patient is aware the risks include but are not limited to: bleeding, infection, steal syndrome, nerve damage, ischemic monomelic neuropathy, failure to mature, and need for additional procedures.  The patient is aware of the risks of the procedure and elects to proceed forward.  DESCRIPTION: After full informed written consent was obtained from the patient, the patient was brought back to the operating room and placed supine upon the operating table.  Prior to induction, the patient received IV antibiotics.   After obtaining adequate anesthesia, the patient was then prepped and draped in the standard fashion for a right arm access procedure.  I turned my attention first to identifying the patient's cephalic vein and brachial artery.  Using SonoSite guidance, the location of these vessels were marked out on the skin.   At this point, I injected local anesthetic to obtain a field block of the antecubitum.  In total, I injected about 15 mL of a 1:1 mixture of 0.5% Marcaine without epinephrine and 1% lidocaine with epinephrine.  I made a longitudinal incision at the level of the antecubitum and dissected through the subcutaneous tissue and fascia to gain exposure of the brachial artery.  This was noted to be 3 mm in diameter externally.  This was dissected out proximally and distally and controlled with  vessel loops .  I then dissected out the cephalic vein.  This was noted to be 3 mm in diameter externally.  The distal segment of the vein was ligated with a  2-0 silk, and the vein was transected.  The proximal segment was iinterrogated with serial dilators.  The vein accepted up to a 3 mm dilator without any difficulty.  I then instilled the heparinized saline into the vein and clamped it.  At this point, I reset my exposure of the brachial artery and placed the artery under tension proximally and distally.  I made an arteriotomy with a #11 blade, and then I extended the arteriotomy with a Potts scissor.  I injected heparinized saline proximal and distal to this arteriotomy.  The vein was then sewn to the artery in an end-to-side configuration with a running stitch of 7-0 Prolene.  Prior to completing this anastomosis, I allowed the vein and artery to backbleed.  There was no evidence of clot from any vessels.  I completed the anastomosis in the usual fashion and then released all vessel loops and clamps.  There was a palpable  thrill in the venous outflow, and there was a dopplerable radial pulse.  At this point, I irrigated out the surgical wound.  There was no further active bleeding.  The subcutaneous tissue was reapproximated with a running stitch of 3-0 Vicryl.  The skin was then reapproximated with a running subcuticular stitch of 4-0 Vicryl.  The skin was then cleaned, dried, and reinforced with Dermabond.  The patient tolerated this procedure well.   COMPLICATIONS: none  CONDITION: stable  Adele Barthel, MD  [facility] Office: [phone] Pager: [phone]  05/19/2013, 9:42 AM

## 2013-05-19 NOTE — Interval H&P Note (Signed)
History and Physical Interval Note:  05/19/2013 7:18 AM  Charles [name]  has presented today for surgery, with the diagnosis of End Stage Renal Disease  The various methods of treatment have been discussed with the patient and family. After consideration of risks, benefits and other options for treatment, the patient has consented to  Procedure(s): RIGHT BRACHIOCEPHALIC  ARTERIOVENOUS (AV) FISTULA CREATION (Right) as a surgical intervention .  The patient's history has been reviewed, patient examined, no change in status, stable for surgery.  I have reviewed the patient's chart and labs.  Questions were answered to the patient's satisfaction.     CHEN,BRIAN LIANG-YU

## 2013-05-19 NOTE — Telephone Encounter (Addendum)
Message copied by Gena Fray on Mon May 19, 2013  1:49 PM ------      Message from: Alfonso Patten      Created: Mon May 19, 2013 11:23 AM                   ----- Message -----         From: Conrad Hyde, MD         Sent: 05/19/2013   9:49 AM           To: Patrici Ranks, Alfonso Patten, RN            MACADE ROBARTS      [id number]      July 22, 1956            PROCEDURE:      right brachiocephalic arteriovenous fistula placement            Asst: Wray Kearns, [facility]             Follow-up: 4 weeks ------  05/19/13: lm at home # regarding appt on 06/20/13 at 1130am. dpm

## 2013-05-19 NOTE — Transfer of Care (Signed)
Immediate Anesthesia Transfer of Care Note  Patient: Charles [name]  Procedure(s) Performed: Procedure(s): RIGHT BRACHIOCEPHALIC  ARTERIOVENOUS (AV) FISTULA CREATION (Right)  Patient Location: PACU  Anesthesia Type:MAC  Level of Consciousness: awake, alert  and patient cooperative  Airway & Oxygen Therapy: Patient Spontanous Breathing  Post-op Assessment: Report given to PACU RN and Post -op Vital signs reviewed and stable  Post vital signs: Reviewed and stable  Complications: No apparent anesthesia complications

## 2013-05-19 NOTE — H&P (View-Only) (Signed)
Subjective:     Patient ID: Charles [name], male   DOB: [birth year], 57 y.o.   MRN: [id number]  HPI this 57 year old male was referred for evaluation for vascular access. He has no history of being on hemodialysis. He is right-handed. He does have type 2 diabetes mellitus. He denies any history of coronary artery disease.  Past Medical History  Diagnosis Date  . Hypertension   . Diabetes mellitus without complication     History  Substance Use Topics  . Smoking status: Never Smoker   . Smokeless tobacco: Never Used  . Alcohol Use: No    History reviewed. No pertinent family history.  Allergies not on file  Current outpatient prescriptions:hydrALAZINE (APRESOLINE) 50 MG tablet, Take 1 tablet (50 mg total) by mouth 3 (three) times daily., Disp: 90 tablet, Rfl: 3;  insulin detemir (LEVEMIR FLEXPEN) 100 UNIT/ML injection, Inject 30 Units into the skin at bedtime., Disp: 10 mL, Rfl: 5;  labetalol (NORMODYNE) 300 MG tablet, Take 1 tablet (300 mg total) by mouth 2 (two) times daily., Disp: 60 tablet, Rfl: 3  BP 176/104  Pulse 86  Resp 18  Ht 5\' 11"  (1.803 m)  Wt 199 lb 8 oz (90.493 kg)  BMI 27.84 kg/m2  Body mass index is 27.84 kg/(m^2).          Review of Systems denies chest pain, dyspnea on exertion, PND, orthopnea, hemoptysis. Other systems negative and complete review of systems    Objective:   Physical Exam BP 176/104  Pulse 86  Resp 18  Ht 5\' 11"  (1.803 m)  Wt 199 lb 8 oz (90.493 kg)  BMI 27.84 kg/m2  Gen.-alert and oriented x3 in no apparent distress HEENT normal for age Lungs no rhonchi or wheezing Cardiovascular regular rhythm no murmurs carotid pulses 3+ palpable no bruits audible Abdomen soft nontender no palpable masses Musculoskeletal free of  major deformities Skin clear -no rashes Neurologic normal Lower extremities 3+ femoral and dorsalis pedis pulses palpable bilaterally with no edema  Today I ordered a bilateral upper extremity vein mapping  which I reviewed and interpreted. Patient has small veins bilaterally. The largest cephalic vein is in the right upper arm. Both basilic veins are adequate in size but do connect to the deep system in the upper third of the arm bilaterally. I confirmed these findings with the SonoSite.     Assessment:     Chronic renal insufficiency not yet on hemodialysis needs vascular access-patient has generally small veins    Plan:     Best plan would be to attempt right brachial-cephalic AV fistula even though cephalic vein is somewhat borderline in size. If this is not satisfactory we'll then need to go to basilic vein transposition probably in left upper extremity initially Will schedule that for Monday, October 20 for Dr. Bridgett [name] Possibility of failure of fistula and need for further surgery was discussed with patient he understands

## 2013-05-19 NOTE — Anesthesia Preprocedure Evaluation (Addendum)
Anesthesia Evaluation   Patient awake    Reviewed: Allergy & Precautions, H&P , NPO status , Patient's Chart, lab work & pertinent test results  Airway Mallampati: II TM Distance: >3 FB Neck ROM: Full    Dental  (+) Teeth Intact   Pulmonary          Cardiovascular hypertension,     Neuro/Psych    GI/Hepatic Neg liver ROS,   Endo/Other  diabetes  Renal/GU Renal disease     Musculoskeletal   Abdominal   Peds  Hematology   Anesthesia Other Findings   Reproductive/Obstetrics                          Anesthesia Physical Anesthesia Plan  ASA: III  Anesthesia Plan: MAC   Post-op Pain Management:    Induction: Intravenous  Airway Management Planned: Simple Face Mask  Additional Equipment:   Intra-op Plan:   Post-operative Plan:   Informed Consent:   Dental advisory given  Plan Discussed with: CRNA and Anesthesiologist  Anesthesia Plan Comments:         Anesthesia Quick Evaluation

## 2013-05-22 ENCOUNTER — Encounter (HOSPITAL_COMMUNITY): Payer: Self-pay | Admitting: Vascular Surgery

## 2013-06-04 ENCOUNTER — Other Ambulatory Visit: Payer: Self-pay | Admitting: *Deleted

## 2013-06-04 DIAGNOSIS — N186 End stage renal disease: Secondary | ICD-10-CM

## 2013-06-04 DIAGNOSIS — Z4931 Encounter for adequacy testing for hemodialysis: Secondary | ICD-10-CM

## 2013-06-19 ENCOUNTER — Encounter: Payer: Self-pay | Admitting: Vascular Surgery

## 2013-06-20 ENCOUNTER — Encounter: Payer: BC Managed Care – PPO | Admitting: Vascular Surgery

## 2013-06-20 ENCOUNTER — Inpatient Hospital Stay (HOSPITAL_COMMUNITY): Admission: RE | Admit: 2013-06-20 | Payer: BC Managed Care – PPO | Source: Ambulatory Visit

## 2013-09-18 ENCOUNTER — Encounter: Payer: Self-pay | Admitting: Vascular Surgery

## 2013-09-19 ENCOUNTER — Encounter: Payer: BC Managed Care – PPO | Admitting: Vascular Surgery

## 2013-10-02 ENCOUNTER — Encounter: Payer: Self-pay | Admitting: Vascular Surgery

## 2013-10-03 ENCOUNTER — Ambulatory Visit (INDEPENDENT_AMBULATORY_CARE_PROVIDER_SITE_OTHER): Payer: BC Managed Care – PPO | Admitting: Vascular Surgery

## 2013-10-03 ENCOUNTER — Encounter: Payer: Self-pay | Admitting: Vascular Surgery

## 2013-10-03 VITALS — BP 183/88 | HR 80 | Ht 71.0 in | Wt 192.3 lb

## 2013-10-03 DIAGNOSIS — N179 Acute kidney failure, unspecified: Secondary | ICD-10-CM

## 2013-10-03 DIAGNOSIS — N186 End stage renal disease: Secondary | ICD-10-CM | POA: Insufficient documentation

## 2013-10-03 NOTE — Progress Notes (Signed)
    Postoperative Access Visit   History of Present Illness  DAYVION SLUIS is a 58 y.o. year old male who presents for postoperative follow-up for: R BC AVF (Date: 05/19/13).  The patient's wounds are healed.  The patient notes no steal symptoms.  The patient is able to complete their activities of daily living.  The patient's current symptoms are: none.  For VQI Use Only  PRE-ADM LIVING: Home  AMB STATUS: Ambulatory  Physical Examination Filed Vitals:   10/03/13 1554  BP: 183/88  Pulse: 80    RUE: Incision is healed, skin feels warm, hand grip is 5/5, sensation in digits is intact, palpable thrill, bruit can be auscultated , fistula > 6 mm throughout  [place] is a 58 y.o. year old male who presents s/p R BC AVF.  The patient's access is ready for use  Thank you for allowing [place] to participate in this patient's care.  Adele Barthel, MD [facility] Office: [phone] Pager: [phone]  10/03/2013, 4:28 PM

## 2014-06-15 IMAGING — CR DG CHEST 2V
2 series · 2 of 2 positions shown · non-contrast
Comparison: Chest radiograph 06/16/2010

CLINICAL DATA: Right AV fistula.  Hypertension. Diabetes.

EXAM:
CHEST  2 VIEW

[w chest pa]
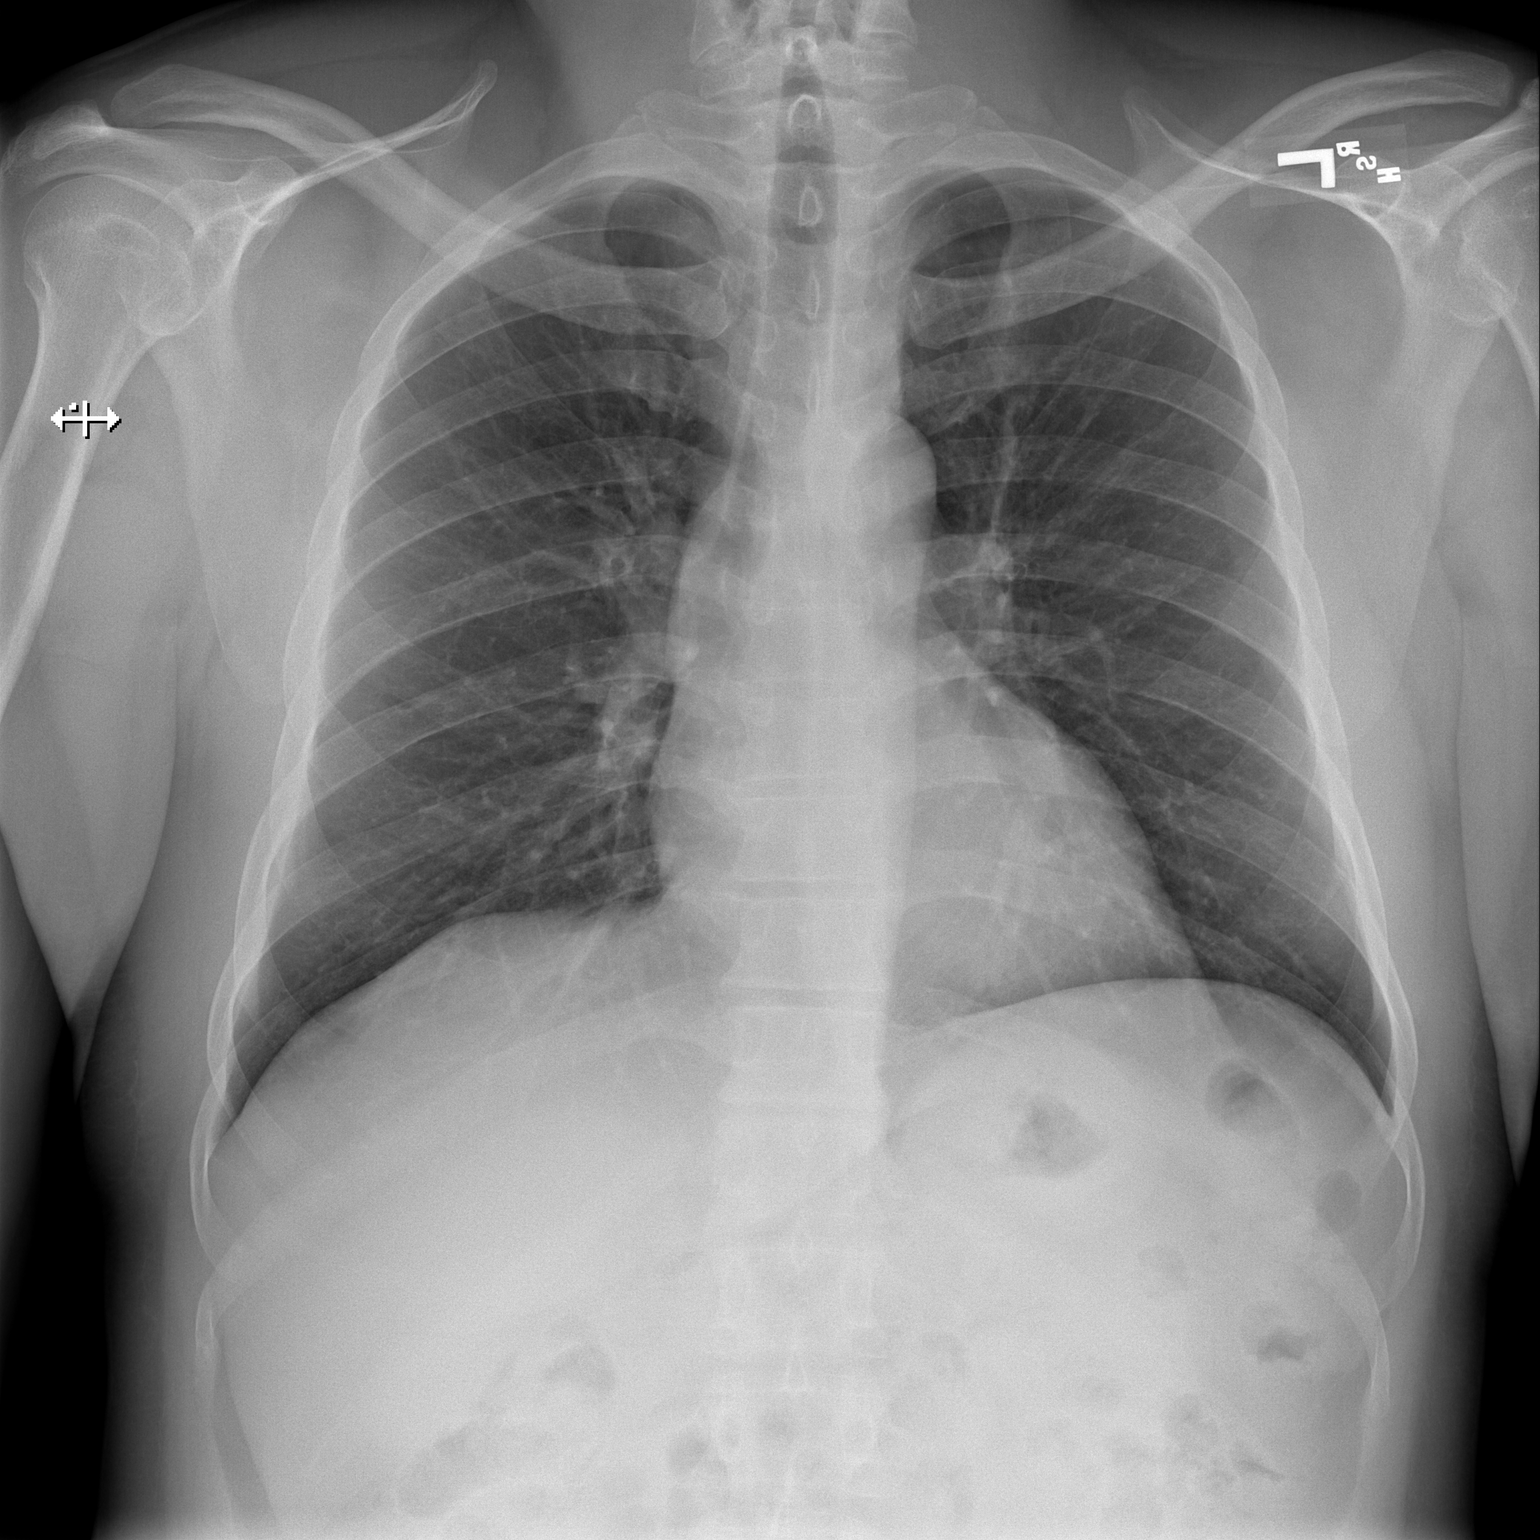

[w chest lat]
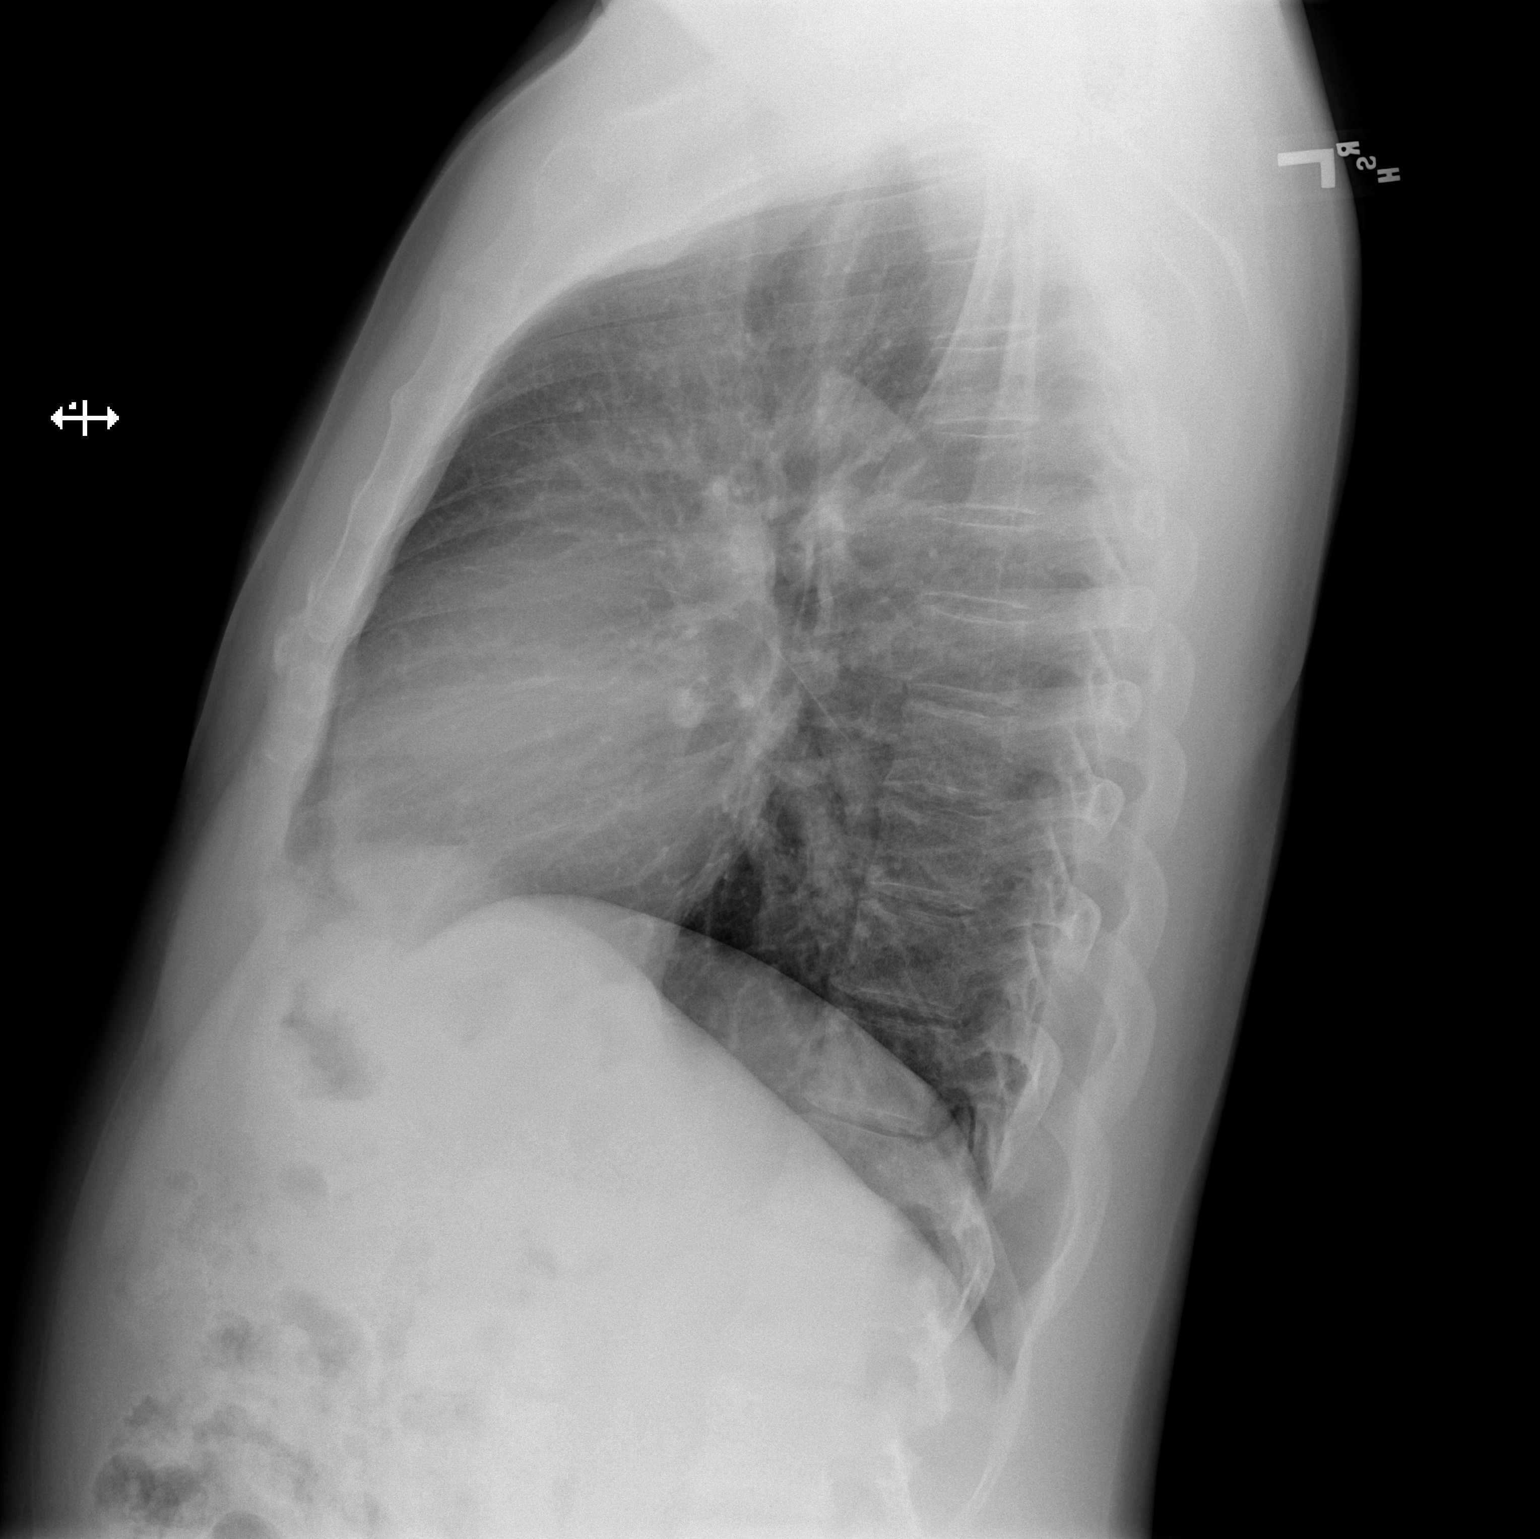

[2 of 2 positions shown; findings below may reference images not displayed]

FINDINGS: The heart size and mediastinal contours are within normal limits.
Both lungs are clear. The visualized skeletal structures are
unremarkable.
IMPRESSION: No active cardiopulmonary disease.

## 2015-01-20 ENCOUNTER — Encounter: Payer: Self-pay | Admitting: Internal Medicine

## 2015-01-21 ENCOUNTER — Ambulatory Visit (HOSPITAL_COMMUNITY): Payer: Self-pay | Admitting: Dentistry

## 2015-01-21 ENCOUNTER — Encounter (INDEPENDENT_AMBULATORY_CARE_PROVIDER_SITE_OTHER): Payer: Self-pay

## 2015-01-21 ENCOUNTER — Encounter (HOSPITAL_COMMUNITY): Payer: Self-pay | Admitting: Dentistry

## 2015-01-21 VITALS — BP 119/60 | HR 66 | Temp 98.7°F

## 2015-01-21 DIAGNOSIS — N186 End stage renal disease: Secondary | ICD-10-CM

## 2015-01-21 DIAGNOSIS — K036 Deposits [accretions] on teeth: Secondary | ICD-10-CM

## 2015-01-21 DIAGNOSIS — K0889 Other specified disorders of teeth and supporting structures: Secondary | ICD-10-CM

## 2015-01-21 DIAGNOSIS — Z01818 Encounter for other preprocedural examination: Secondary | ICD-10-CM | POA: Diagnosis not present

## 2015-01-21 DIAGNOSIS — K030 Excessive attrition of teeth: Secondary | ICD-10-CM

## 2015-01-21 DIAGNOSIS — K0530 Chronic periodontitis, unspecified: Secondary | ICD-10-CM

## 2015-01-21 DIAGNOSIS — D165 Benign neoplasm of lower jaw bone: Secondary | ICD-10-CM

## 2015-01-21 DIAGNOSIS — K029 Dental caries, unspecified: Secondary | ICD-10-CM

## 2015-01-21 DIAGNOSIS — IMO0002 Reserved for concepts with insufficient information to code with codable children: Secondary | ICD-10-CM

## 2015-01-21 NOTE — Progress Notes (Signed)
DENTAL CONSULTATION  Date of Consultation:  01/21/2015 Patient Name:   Charles [name] Date of Birth:   [birth year] Medical Record Number: [id number]  VITALS: BP 119/60 mmHg  Pulse 66  Temp(Src) 98.7 F (37.1 C) (Oral)  CHIEF COMPLAINT: Patient is referred by Dr. Jimmy Footman dental consultation.  HPI: Charles [name] is a 59 year old male referred by Dr. Jimmy Footman for dental consultation. Patient with end-stage renal disease with hemodialysis on Monday Wednesday and Friday. Patient with anticipated kidney transplant. Patient is now seen as part of a medically necessary pre-kidney transplant dental protocol examination to rule out dental infection that may affect the patient's systemic health and anticipated kidney transplant surgery.  The patient currently denies acute toothaches, swellings, or abscesses. Patient has NEVER seen a dentist. Patient came to [place] 25 years ago from [place].  PROBLEM LIST: Patient Active Problem List   Diagnosis Date Noted  . End stage renal disease 10/03/2013  . ARF (acute renal failure) 05/06/2013  . RENAL INSUFFICIENCY, ACUTE 08/19/2009  . APPENDECTOMY, HX OF 09/11/2007  . ERECTILE DYSFUNCTION 07/15/2007  . PROTEINURIA 07/15/2007  . ANEMIA 06/17/2007  . DIABETES MELLITUS, TYPE II 05/20/2007  . HYPERLIPIDEMIA 05/20/2007  . HYPERTENSION 05/20/2007    PMH: Past Medical History  Diagnosis Date  . Hypertension   . Diabetes mellitus without complication   . End stage kidney disease   . High cholesterol   . Glaucoma   . Anemia     PSH: Past Surgical History  Procedure Laterality Date  . Appendectomy    . Av fistula placement Right 05/19/2013    Procedure: RIGHT BRACHIOCEPHALIC  ARTERIOVENOUS (AV) FISTULA CREATION;  Surgeon: Conrad Elgin, MD;  Location: [facility];  Service: Vascular;  Laterality: Right;    ALLERGIES: No Known Allergies  MEDICATIONS: Current Outpatient Prescriptions  Medication Sig Dispense Refill  . amLODipine  (NORVASC) 10 MG tablet 5 mg at bedtime.     Marland Kitchen b complex-vitamin c-folic acid (NEPHRO-VITE) 0.8 MG TABS tablet Take 4 tablets by mouth 3 (three) times daily after meals.    . cinacalcet (SENSIPAR) 60 MG tablet Take 60 mg by mouth daily.    . hydrALAZINE (APRESOLINE) 50 MG tablet 50 mg 2 (two) times daily.     . insulin glargine (LANTUS) 100 UNIT/ML injection Inject 25 Units into the skin at bedtime.    Marland Kitchen labetalol (NORMODYNE) 200 MG tablet 400 mg 2 (two) times daily.     . Multiple Vitamin (MULTIVITAMIN WITH MINERALS) TABS tablet Take 1 tablet by mouth daily.    . Rosuvastatin Calcium (CRESTOR PO) Take 20 mg by mouth at bedtime.     . sevelamer carbonate (RENVELA) 800 MG tablet Take 800 mg by mouth 4 (four) times daily.    . timolol (TIMOPTIC) 0.5 % ophthalmic solution Place 1 drop into the right eye 3 (three) times daily.      No current facility-administered medications for this visit.    LABS: Lab Results  Component Value Date   WBC 5.6 08/29/2012   HGB 10.9* 05/19/2013   HCT 32.0* 05/19/2013   MCV 82.9 08/29/2012   PLT 162 08/29/2012      Component Value Date/Time   NA 141 05/19/2013 0644   K 3.9 05/19/2013 0644   CL 101 08/29/2012 1812   CO2 24 08/29/2012 1812   GLUCOSE 155* 05/19/2013 0644   BUN 40* 08/29/2012 1812   CREATININE 4.08* 08/29/2012 1812   CALCIUM 8.8 08/29/2012 1812   GFRNONAA 15* 08/29/2012 1812  GFRAA 18* 08/29/2012 1812   No results found for: INR, PROTIME No results found for: PTT  SOCIAL HISTORY: History   Social History  . Marital Status: Married    Spouse Name: N/A  . Number of Children: N/A  . Years of Education: N/A   Occupational History  . Not on file.   Social History Main Topics  . Smoking status: Never Smoker   . Smokeless tobacco: Never Used  . Alcohol Use: No  . Drug Use: No  . Sexual Activity: Not on file   Other Topics Concern  . Not on file   Social History Narrative    FAMILY HISTORY: History reviewed. No  pertinent family history. Mother and father are both deceased.  REVIEW OF SYSTEMS: Reviewed with the patient and is included in dental record.  DENTAL HISTORY: CHIEF COMPLAINT: Patient is referred by Dr. Jimmy Footman dental consultation.  HPI: Charles [name] is a 59 year old male referred by Dr. Jimmy Footman for dental consultation. Patient with end-stage renal disease with hemodialysis on Monday Wednesday and Friday. Patient with anticipated kidney transplant. Patient is now seen as part of a medically necessary pre-kidney transplant dental protocol examination to rule out dental infection that may affect the patient's systemic health and anticipated kidney transplant surgery.  The patient currently denies acute toothaches, swellings, or abscesses. Patient has NEVER seen a dentist. Patient came to [place] 25 years ago from [place].  DENTAL EXAMINATION: GENERAL: The patient is a well-developed, well-nourished male in no acute distress. HEAD AND NECK: There is no palpable submandibular lymphadenopathy. The patient denies acute TMJ symptoms. INTRAORAL EXAM: The patient has normal saliva. There is no evidence of oral abscess formation. DENTITION: The patient is not missing any teeth. There is evidence of significant incisal and occlusal attrition. PERIODONTAL: Patient has chronic periodontitis with plaque and calculus accumulations, generalized gingival recession, and incipient tooth mobility. There is moderate to severe bone loss noted. DENTAL CARIES/SUBOPTIMAL RESTORATIONS: Extensive dental caries are associated with tooth #17. ENDODONTIC: The patient denies acute pulpitis symptoms. There are multiple areas of periapical radiolucency and periapical radiopacities. Electric pulp testing was normal. 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 CROWN AND BRIDGE: There are no crown or bridge restorations. PROSTHODONTIC: There are no partial dentures. OCCLUSION: The patient has a  poor occlusal scheme secondary to significant incisal and occlusal attrition but is stable at this time. Multiple diastemas are noted.  RADIOGRAPHIC INTERPRETATION: An orthopantogram was taken and supplemented with a full series of dental radiographs. There are no missing teeth. There are dental caries noted. There is moderate bone loss noted. There is evidence of radiographic calculus noted. Multiple diastemas are noted. Multiple periapical radiolucencies and radiopacities are noted.   ASSESSMENTS: 1. End stage renal disease with hemodialysis on Monday, Wednesday and Friday. 2. Pre-kidney transplant dental protocol 3. Chronic periodontitis with bone loss 4. Gingival recession 5. Accretions 6. Tooth mobility 7. Dental caries 8. Generalized occlusal and incisal attrition 9. Multiple diastemas 10. Poor occlusal scheme but a stable occlusion  PLAN/RECOMMENDATIONS: 1. I discussed the risks, benefits, and complications of various treatment options with the patient in relationship to his medical and dental conditions, end-stage renal disease, and anticipated kidney transplant. We discussed various treatment options to include no treatment, multiple extractions with alveoloplasty, pre-prosthetic surgery as indicated, periodontal therapy, dental restorations, root canal therapy, crown and bridge therapy, implant therapy, and replacement of missing teeth as indicated. The patient currently wishes to proceed with extraction of tooth numbers 1, 2, 15, 16,  17, and 32 with alveoloplasty and gross debridement of remaining dentition in the operating room with general anesthesia. This has been scheduled for 01/28/2015 at 7:30 AM at [facility]. The patient will then need to follow-up with a dentist of his choice for continued periodontal therapy and dental follow-up as needed on an every 4-6 month basis. The patient was offered the option of being referred to a periodontist and oral surgeon but refused  at this time. The patient will need a preop history and physical by the nephrology team prior to surgery on 01/28/2015. [facility] has been contacted and will arrange for this procedure.   2. Discussion of findings with medical team and coordination of future medical and dental care as needed.   I spent in excess of  120 minutes during the conduct of this consultation and >50% of this time involved direct face-to-face encounter for counseling and/or coordination of the patient's care.    Lenn Cal, DDS

## 2015-01-27 ENCOUNTER — Encounter (HOSPITAL_COMMUNITY): Payer: Self-pay | Admitting: *Deleted

## 2015-01-27 NOTE — Progress Notes (Signed)
H&P in chart.

## 2015-01-27 NOTE — Progress Notes (Signed)
Spoke with a nurse at the dialysis center about the H&P (pt states it was done Monday, the 27th). She states she will fax a copy to [place].

## 2015-01-28 ENCOUNTER — Encounter (HOSPITAL_COMMUNITY)
Admission: RE | Disposition: A | Discharge status: Home / Self Care | Payer: Self-pay | Source: Ambulatory Visit | Attending: Dentistry

## 2015-01-28 ENCOUNTER — Ambulatory Visit (HOSPITAL_COMMUNITY): Payer: BLUE CROSS/BLUE SHIELD | Admitting: Anesthesiology

## 2015-01-28 ENCOUNTER — Encounter (HOSPITAL_COMMUNITY): Payer: Self-pay | Admitting: *Deleted

## 2015-01-28 ENCOUNTER — Ambulatory Visit (HOSPITAL_COMMUNITY)
Admission: RE | Admit: 2015-01-28 | Discharge: 2015-01-28 | Disposition: A | Discharge status: Home / Self Care | Payer: BLUE CROSS/BLUE SHIELD | Source: Ambulatory Visit | Attending: Dentistry | Admitting: Dentistry

## 2015-01-28 DIAGNOSIS — E785 Hyperlipidemia, unspecified: Secondary | ICD-10-CM | POA: Insufficient documentation

## 2015-01-28 DIAGNOSIS — D631 Anemia in chronic kidney disease: Secondary | ICD-10-CM | POA: Insufficient documentation

## 2015-01-28 DIAGNOSIS — I120 Hypertensive chronic kidney disease with stage 5 chronic kidney disease or end stage renal disease: Secondary | ICD-10-CM | POA: Insufficient documentation

## 2015-01-28 DIAGNOSIS — H409 Unspecified glaucoma: Secondary | ICD-10-CM | POA: Diagnosis not present

## 2015-01-28 DIAGNOSIS — N179 Acute kidney failure, unspecified: Secondary | ICD-10-CM | POA: Insufficient documentation

## 2015-01-28 DIAGNOSIS — E1122 Type 2 diabetes mellitus with diabetic chronic kidney disease: Secondary | ICD-10-CM | POA: Insufficient documentation

## 2015-01-28 DIAGNOSIS — E780 Pure hypercholesterolemia: Secondary | ICD-10-CM | POA: Insufficient documentation

## 2015-01-28 DIAGNOSIS — Z79899 Other long term (current) drug therapy: Secondary | ICD-10-CM | POA: Diagnosis not present

## 2015-01-28 DIAGNOSIS — K036 Deposits [accretions] on teeth: Secondary | ICD-10-CM | POA: Insufficient documentation

## 2015-01-28 DIAGNOSIS — R809 Proteinuria, unspecified: Secondary | ICD-10-CM | POA: Insufficient documentation

## 2015-01-28 DIAGNOSIS — K0530 Chronic periodontitis, unspecified: Secondary | ICD-10-CM | POA: Diagnosis not present

## 2015-01-28 DIAGNOSIS — K060 Gingival recession: Secondary | ICD-10-CM

## 2015-01-28 DIAGNOSIS — N186 End stage renal disease: Secondary | ICD-10-CM | POA: Diagnosis not present

## 2015-01-28 DIAGNOSIS — Z794 Long term (current) use of insulin: Secondary | ICD-10-CM | POA: Diagnosis not present

## 2015-01-28 DIAGNOSIS — Z01818 Encounter for other preprocedural examination: Secondary | ICD-10-CM

## 2015-01-28 HISTORY — PX: MULTIPLE EXTRACTIONS WITH ALVEOLOPLASTY: SHX5342

## 2015-01-28 LAB — GLUCOSE, CAPILLARY: Glucose-Capillary: 98 mg/dL (ref 65–99)

## 2015-01-28 LAB — POCT I-STAT 4, (NA,K, GLUC, HGB,HCT)
GLUCOSE: 84 mg/dL (ref 65–99)
HCT: 42 % (ref 39.0–52.0)
HEMOGLOBIN: 14.3 g/dL (ref 13.0–17.0)
POTASSIUM: 4.7 mmol/L (ref 3.5–5.1)
SODIUM: 137 mmol/L (ref 135–145)

## 2015-01-28 SURGERY — MULTIPLE EXTRACTION WITH ALVEOLOPLASTY
Anesthesia: General | Site: Mouth

## 2015-01-28 MED ORDER — MORPHINE SULFATE 2 MG/ML IJ SOLN
INTRAMUSCULAR | Status: AC
  Filled 2015-01-28: qty 1

## 2015-01-28 MED ORDER — GLYCOPYRROLATE 0.2 MG/ML IJ SOLN
INTRAMUSCULAR | Status: DC | PRN
  Administered 2015-01-28: 0.4 mg via INTRAVENOUS
  Administered 2015-01-28: 0.2 mg via INTRAVENOUS

## 2015-01-28 MED ORDER — GLYCOPYRROLATE 0.2 MG/ML IJ SOLN
INTRAMUSCULAR | Status: AC
  Filled 2015-01-28: qty 3

## 2015-01-28 MED ORDER — 0.9 % SODIUM CHLORIDE (POUR BTL) OPTIME
TOPICAL | Status: DC | PRN
  Administered 2015-01-28: 1000 mL

## 2015-01-28 MED ORDER — ROCURONIUM BROMIDE 50 MG/5ML IV SOLN
INTRAVENOUS | Status: AC
  Filled 2015-01-28: qty 1

## 2015-01-28 MED ORDER — OXYCODONE-ACETAMINOPHEN 5-325 MG PO TABS: ORAL_TABLET | ORAL | Status: DC

## 2015-01-28 MED ORDER — PROPOFOL 10 MG/ML IV BOLUS
INTRAVENOUS | Status: AC
  Filled 2015-01-28: qty 20

## 2015-01-28 MED ORDER — MORPHINE SULFATE 4 MG/ML IJ SOLN
INTRAMUSCULAR | Status: AC
  Administered 2015-01-28: 2 mg
  Filled 2015-01-28: qty 1

## 2015-01-28 MED ORDER — NEOSTIGMINE METHYLSULFATE 10 MG/10ML IV SOLN
INTRAVENOUS | Status: DC | PRN
  Administered 2015-01-28: 3 mg via INTRAVENOUS

## 2015-01-28 MED ORDER — ONDANSETRON HCL 4 MG/2ML IJ SOLN
INTRAMUSCULAR | Status: AC
  Filled 2015-01-28: qty 2

## 2015-01-28 MED ORDER — CEFAZOLIN SODIUM-DEXTROSE 2-3 GM-% IV SOLR
INTRAVENOUS | Status: AC
  Filled 2015-01-28: qty 50

## 2015-01-28 MED ORDER — BUPIVACAINE-EPINEPHRINE 0.5% -1:200000 IJ SOLN
INTRAMUSCULAR | Status: DC | PRN
  Administered 2015-01-28: 3.4 mL

## 2015-01-28 MED ORDER — FENTANYL CITRATE (PF) 250 MCG/5ML IJ SOLN
INTRAMUSCULAR | Status: AC
  Filled 2015-01-28: qty 5

## 2015-01-28 MED ORDER — OXYMETAZOLINE HCL 0.05 % NA SOLN
NASAL | Status: DC | PRN
  Administered 2015-01-28: 1

## 2015-01-28 MED ORDER — HYDRALAZINE HCL 20 MG/ML IJ SOLN
INTRAMUSCULAR | Status: AC
  Administered 2015-01-28: 10 mg via INTRAVENOUS
  Filled 2015-01-28: qty 1

## 2015-01-28 MED ORDER — HEMOSTATIC AGENTS (NO CHARGE) OPTIME
TOPICAL | Status: DC | PRN
  Administered 2015-01-28: 1

## 2015-01-28 MED ORDER — SODIUM CHLORIDE 0.9 % IV SOLN
INTRAVENOUS | Status: DC | PRN
  Administered 2015-01-28: 07:00:00 via INTRAVENOUS

## 2015-01-28 MED ORDER — SUCCINYLCHOLINE CHLORIDE 20 MG/ML IJ SOLN
INTRAMUSCULAR | Status: AC
  Filled 2015-01-28: qty 1

## 2015-01-28 MED ORDER — PROMETHAZINE HCL 25 MG/ML IJ SOLN: 6.2500 mg | INTRAMUSCULAR | Status: DC | PRN

## 2015-01-28 MED ORDER — PHENYLEPHRINE 40 MCG/ML (10ML) SYRINGE FOR IV PUSH (FOR BLOOD PRESSURE SUPPORT)
PREFILLED_SYRINGE | INTRAVENOUS | Status: AC
  Filled 2015-01-28: qty 10

## 2015-01-28 MED ORDER — PROPOFOL 10 MG/ML IV BOLUS
INTRAVENOUS | Status: DC | PRN
  Administered 2015-01-28: 200 mg via INTRAVENOUS

## 2015-01-28 MED ORDER — SUCCINYLCHOLINE CHLORIDE 20 MG/ML IJ SOLN
INTRAMUSCULAR | Status: DC | PRN
  Administered 2015-01-28: 110 mg via INTRAVENOUS

## 2015-01-28 MED ORDER — HYDRALAZINE HCL 20 MG/ML IJ SOLN
10.0000 mg | Freq: Once | INTRAMUSCULAR | Status: AC
  Administered 2015-01-28: 10 mg via INTRAVENOUS

## 2015-01-28 MED ORDER — EPHEDRINE SULFATE 50 MG/ML IJ SOLN
INTRAMUSCULAR | Status: AC
  Filled 2015-01-28: qty 1

## 2015-01-28 MED ORDER — BUPIVACAINE-EPINEPHRINE (PF) 0.5% -1:200000 IJ SOLN
INTRAMUSCULAR | Status: AC
  Filled 2015-01-28: qty 3.6

## 2015-01-28 MED ORDER — NEOSTIGMINE METHYLSULFATE 10 MG/10ML IV SOLN
INTRAVENOUS | Status: AC
  Filled 2015-01-28: qty 1

## 2015-01-28 MED ORDER — LIDOCAINE-EPINEPHRINE 2 %-1:100000 IJ SOLN
INTRAMUSCULAR | Status: DC | PRN
  Administered 2015-01-28: 6.8 mL via INTRADERMAL

## 2015-01-28 MED ORDER — ARTIFICIAL TEARS OP OINT
TOPICAL_OINTMENT | OPHTHALMIC | Status: AC
  Filled 2015-01-28: qty 3.5

## 2015-01-28 MED ORDER — EPHEDRINE SULFATE 50 MG/ML IJ SOLN
INTRAMUSCULAR | Status: DC | PRN
  Administered 2015-01-28: 5 mg via INTRAVENOUS

## 2015-01-28 MED ORDER — CEFAZOLIN SODIUM-DEXTROSE 2-3 GM-% IV SOLR
INTRAVENOUS | Status: DC | PRN
  Administered 2015-01-28: 2 g via INTRAVENOUS

## 2015-01-28 MED ORDER — LIDOCAINE-EPINEPHRINE 2 %-1:100000 IJ SOLN
INTRAMUSCULAR | Status: AC
  Filled 2015-01-28: qty 10.2

## 2015-01-28 MED ORDER — FENTANYL CITRATE (PF) 100 MCG/2ML IJ SOLN
INTRAMUSCULAR | Status: DC | PRN
  Administered 2015-01-28: 25 ug via INTRAVENOUS
  Administered 2015-01-28: 75 ug via INTRAVENOUS
  Administered 2015-01-28 (×2): 25 ug via INTRAVENOUS

## 2015-01-28 MED ORDER — ROCURONIUM BROMIDE 100 MG/10ML IV SOLN
INTRAVENOUS | Status: DC | PRN
  Administered 2015-01-28: 30 mg via INTRAVENOUS
  Administered 2015-01-28: 10 mg via INTRAVENOUS

## 2015-01-28 MED ORDER — ONDANSETRON HCL 4 MG/2ML IJ SOLN
INTRAMUSCULAR | Status: DC | PRN
  Administered 2015-01-28: 4 mg via INTRAVENOUS

## 2015-01-28 MED ORDER — MIDAZOLAM HCL 5 MG/5ML IJ SOLN
INTRAMUSCULAR | Status: DC | PRN
  Administered 2015-01-28: 1 mg via INTRAVENOUS

## 2015-01-28 MED ORDER — LIDOCAINE HCL (CARDIAC) 20 MG/ML IV SOLN
INTRAVENOUS | Status: AC
  Filled 2015-01-28: qty 5

## 2015-01-28 MED ORDER — ARTIFICIAL TEARS OP OINT
TOPICAL_OINTMENT | OPHTHALMIC | Status: DC | PRN
Start: 2015-01-28 — End: 2015-01-28
  Administered 2015-01-28: 1 via OPHTHALMIC

## 2015-01-28 MED ORDER — LIDOCAINE HCL (CARDIAC) 20 MG/ML IV SOLN
INTRAVENOUS | Status: DC | PRN
  Administered 2015-01-28: 70 mg via INTRAVENOUS

## 2015-01-28 MED ORDER — MIDAZOLAM HCL 2 MG/2ML IJ SOLN
INTRAMUSCULAR | Status: AC
  Filled 2015-01-28: qty 2

## 2015-01-28 MED ORDER — SODIUM CHLORIDE 0.9 % IJ SOLN
INTRAMUSCULAR | Status: AC
  Filled 2015-01-28: qty 10

## 2015-01-28 MED ORDER — MORPHINE SULFATE 2 MG/ML IJ SOLN
1.0000 mg | INTRAMUSCULAR | Status: DC | PRN
  Administered 2015-01-28 (×2): 1 mg via INTRAVENOUS
  Administered 2015-01-28: 2 mg via INTRAVENOUS

## 2015-01-28 MED ORDER — OXYMETAZOLINE HCL 0.05 % NA SOLN
NASAL | Status: AC
  Filled 2015-01-28: qty 15

## 2015-01-28 SURGICAL SUPPLY — 35 items
ALCOHOL 70% 16 OZ (MISCELLANEOUS) ×3 IMPLANT
ATTRACTOMAT 16X20 MAGNETIC DRP (DRAPES) ×3 IMPLANT
BLADE SURG 15 STRL LF DISP TIS (BLADE) ×2 IMPLANT
BLADE SURG 15 STRL SS (BLADE) ×4
COVER SURGICAL LIGHT HANDLE (MISCELLANEOUS) ×3 IMPLANT
GAUZE PACKING FOLDED 2  STR (GAUZE/BANDAGES/DRESSINGS) ×2
GAUZE PACKING FOLDED 2 STR (GAUZE/BANDAGES/DRESSINGS) ×1 IMPLANT
GAUZE SPONGE 4X4 16PLY XRAY LF (GAUZE/BANDAGES/DRESSINGS) ×3 IMPLANT
GLOVE BIOGEL PI IND STRL 6 (GLOVE) ×1 IMPLANT
GLOVE BIOGEL PI INDICATOR 6 (GLOVE) ×2
GLOVE SURG ORTHO 8.0 STRL STRW (GLOVE) ×3 IMPLANT
GLOVE SURG SS PI 6.0 STRL IVOR (GLOVE) ×3 IMPLANT
GOWN STRL REUS W/ TWL LRG LVL3 (GOWN DISPOSABLE) ×1 IMPLANT
GOWN STRL REUS W/TWL 2XL LVL3 (GOWN DISPOSABLE) ×3 IMPLANT
GOWN STRL REUS W/TWL LRG LVL3 (GOWN DISPOSABLE) ×2
HEMOSTAT SURGICEL 2X14 (HEMOSTASIS) ×3 IMPLANT
KIT BASIN OR (CUSTOM PROCEDURE TRAY) ×3 IMPLANT
KIT ROOM TURNOVER OR (KITS) ×3 IMPLANT
MANIFOLD NEPTUNE WASTE (CANNULA) ×3 IMPLANT
NEEDLE BLUNT 16X1.5 OR ONLY (NEEDLE) ×3 IMPLANT
NEEDLE DENTAL 27 LONG (NEEDLE) ×6 IMPLANT
NS IRRIG 1000ML POUR BTL (IV SOLUTION) ×3 IMPLANT
PACK EENT II TURBAN DRAPE (CUSTOM PROCEDURE TRAY) ×3 IMPLANT
PAD ARMBOARD 7.5X6 YLW CONV (MISCELLANEOUS) ×3 IMPLANT
SPONGE SURGIFOAM ABS GEL 100 (HEMOSTASIS) ×3 IMPLANT
SPONGE SURGIFOAM ABS GEL 12-7 (HEMOSTASIS) IMPLANT
SPONGE SURGIFOAM ABS GEL SZ50 (HEMOSTASIS) IMPLANT
SUCTION FRAZIER TIP 10 FR DISP (SUCTIONS) ×3 IMPLANT
SUT CHROMIC 3 0 PS 2 (SUTURE) ×9 IMPLANT
SYR 50ML SLIP (SYRINGE) ×3 IMPLANT
TOWEL OR 17X26 10 PK STRL BLUE (TOWEL DISPOSABLE) ×3 IMPLANT
TUBE CONNECTING 12'X1/4 (SUCTIONS) ×1
TUBE CONNECTING 12X1/4 (SUCTIONS) ×2 IMPLANT
WATER STERILE IRR 1000ML POUR (IV SOLUTION) ×3 IMPLANT
YANKAUER SUCT BULB TIP NO VENT (SUCTIONS) ×3 IMPLANT

## 2015-01-28 NOTE — Discharge Instructions (Signed)

## 2015-01-28 NOTE — Anesthesia Preprocedure Evaluation (Signed)
Anesthesia Evaluation  Patient identified by MRN, date of birth, ID band Patient awake    Reviewed: Allergy & Precautions, NPO status , Patient's Chart, lab work & pertinent test results  Airway Mallampati: I   Neck ROM: Full    Dental  (+) Teeth Intact, Poor Dentition   Pulmonary neg pulmonary ROS,  breath sounds clear to auscultation        Cardiovascular hypertension, Rhythm:Regular Rate:Normal     Neuro/Psych negative neurological ROS     GI/Hepatic negative GI ROS, Neg liver ROS,   Endo/Other  diabetes  Renal/GU Dialysis and ESRFRenal disease     Musculoskeletal   Abdominal   Peds  Hematology  (+) anemia ,   Anesthesia Other Findings   Reproductive/Obstetrics                             Anesthesia Physical Anesthesia Plan  ASA: III  Anesthesia Plan: General   Post-op Pain Management:    Induction: Intravenous  Airway Management Planned: Nasal ETT and Oral ETT  Additional Equipment:   Intra-op Plan:   Post-operative Plan: Extubation in OR  Informed Consent: I have reviewed the patients History and Physical, chart, labs and discussed the procedure including the risks, benefits and alternatives for the proposed anesthesia with the patient or authorized representative who has indicated his/her understanding and acceptance.   Dental advisory given  Plan Discussed with: CRNA and Surgeon  Anesthesia Plan Comments:         Anesthesia Quick Evaluation

## 2015-01-28 NOTE — Anesthesia Postprocedure Evaluation (Signed)
  Anesthesia Post-op Note  Patient: Charles [name]  Procedure(s) Performed: Procedure(s): Extraction of tooth #'s [id number] with alveoloplasty and gross debridement of remaining teeth. (N/A)  Patient Location: PACU  Anesthesia Type:General  Level of Consciousness: awake and alert   Airway and Oxygen Therapy: Patient Spontanous Breathing  Post-op Pain: mild  Post-op Assessment: Post-op Vital signs reviewed              Post-op Vital Signs: stable  Last Vitals:  Filed Vitals:   01/28/15 1203  BP: 187/76  Pulse: 73  Temp:   Resp: 9    Complications: No apparent anesthesia complications

## 2015-01-28 NOTE — Progress Notes (Signed)
Lunch relief by MA Ty Oshima RN 

## 2015-01-28 NOTE — Anesthesia Procedure Notes (Signed)
Procedure Name: Intubation Date/Time: 01/28/2015 7:38 AM Performed by: Scheryl Darter Pre-anesthesia Checklist: Patient identified, Emergency Drugs available, Suction available, Patient being monitored and Timeout performed Patient Re-evaluated:Patient Re-evaluated prior to inductionOxygen Delivery Method: Circle system utilized Preoxygenation: Pre-oxygenation with 100% oxygen Intubation Type: IV induction Laryngoscope Size: Miller and 2 Grade View: Grade I Nasal Tubes: Right, Magill forceps - small, utilized and Nasal prep performed Tube size: 7.5 mm Number of attempts: 1 Airway Equipment and Method: Stylet Placement Confirmation: ETT inserted through vocal cords under direct vision,  positive ETCO2 and breath sounds checked- equal and bilateral Tube secured with: Tape Dental Injury: Teeth and Oropharynx as per pre-operative assessment

## 2015-01-28 NOTE — Progress Notes (Signed)
Report to D. Ralene Bathe RN

## 2015-01-28 NOTE — Transfer of Care (Signed)
Immediate Anesthesia Transfer of Care Note  Patient: Charles [name]  Procedure(s) Performed: Procedure(s): Extraction of tooth #'s [id number] with alveoloplasty and gross debridement of remaining teeth. (N/A)  Patient Location: PACU  Anesthesia Type:General  Level of Consciousness: awake, alert , oriented and sedated  Airway & Oxygen Therapy: Patient Spontanous Breathing and Patient connected to nasal cannula oxygen  Post-op Assessment: Report given to RN, Post -op Vital signs reviewed and stable and Patient moving all extremities  Post vital signs: Reviewed and stable  Last Vitals:  Filed Vitals:   01/28/15 0617  BP: 156/77  Pulse: 70  Temp: 37.1 C  Resp: 18    Complications: No apparent anesthesia complications

## 2015-01-28 NOTE — H&P (Signed)
01/28/2015  Patient:            Charles [name] Date of Birth:  [birth year] MRN:                [id number]   BP 156/77 mmHg  Pulse 70  Temp(Src) 98.7 F (37.1 C) (Oral)  Resp 18  Ht 5\' 11"  (1.803 m)  Wt 165 lb 5.5 oz (75 kg)  BMI 23.07 kg/m2  SpO2 100%   [name] a 59 year old male with end-stage renal disease seen as part of a pre-daily transplant dental protocol. Patient now presents for multiple dental extractions with alveoloplasty and gross debridement of remaining dentition the operating room with general anesthesia. Please see H&P from [facility] performed yesterday to actively H&P for dental operating room procedure. This H&P is in the paper chart.  Lenn Cal, DDS

## 2015-01-28 NOTE — Progress Notes (Signed)
Receiving report From [facility]. States he notified Dr.Massagee of high BP and treated with hydralazine.

## 2015-01-28 NOTE — Progress Notes (Signed)
Called Dr.Massagee for sign out. States pt is okay to go home with BP as is.

## 2015-01-28 NOTE — Op Note (Signed)
OPERATIVE REPORT  Patient:            Charles [name] Date of Birth:  18-Jun-1956 MRN:                [id number]   DATE OF PROCEDURE:  01/28/2015  PREOPERATIVE DIAGNOSES: 1.  End-stage renal disease 2.  Pre-kidney transplant dental protocol 3.  Chronic periodontitis 4.  Accretions .     POSTOPERATIVE DIAGNOSES: 1.  End-stage renal disease 2.  Pre-kidney transplant dental protocol 3.  Chronic periodontitis 4.  Accretions .     OPERATIONS: 1. Multiple extraction of tooth numbers  1, 2, 15, 16, 17, and 32 2.  4 Quadrants of alveoloplasty 3. Gross debridement of remaining dentition   SURGEON: Lenn Cal, DDS  ASSISTANT: Camie Patience, (dental assistant)  ANESTHESIA: General anesthesia via nasoendotracheal tube.  MEDICATIONS: 1. Ancef 2 g IV prior to invasive dental procedures. 2. Local anesthesia with a total utilization of  4 carpules each containing 34 mg of lidocaine with 0.017 mg of epinephrine as well as  2 carpules each containing 9 mg of bupivacaine with 0.009 mg of epinephrine.  SPECIMENS: There were 6 teeth that were discarded.  DRAINS: None  CULTURES: None  COMPLICATIONS: None   ESTIMATED BLOOD LOSS: 50 mLs.  INTRAVENOUS FLUIDS: 400 mLs of normal saline solution  INDICATIONS: The patient was  previously diagnosed with end-stage renal disease with hemodialysis on Monday, Wednesday, and Friday. Patient with anticipated kidney transplant. Patient was seen as part of a medically necessary pre-kidney transplant dental protocol. The patient was examined and treatment planned for  multiple dental extractions with alveoloplasty and gross debridement of the remaining dentition in the operating room with general anesthesia.  This treatment plan was formulated to decrease the risks and complications associated with dental infection from affecting the patient's systemic health and  anticipated kidney transplant surgery.  OPERATIVE FINDINGS: Patient was  examined operating room number 9.  The teeth were identified for extraction. The patient was noted be affected by chronic periodontitis and accretions,   DESCRIPTION OF PROCEDURE: Patient was brought to the main operating room number 9. Patient was then placed in the supine position on the operating table.  General anesthesia was then induced per the anesthesia team. The patient was then prepped and draped in the usual manner for dental medicine procedure. A timeout was performed. The patient was identified and procedures were verified. A throat pack was placed at this time. The oral cavity was then thoroughly examined with the findings noted above. The patient was then ready for dental medicine procedure as follows:  Local anesthesia was then administered sequentially with a total utilization of 4 carpules each containing 34 mg of lidocaine with 0.017 mg of epinephrine as well as 2 carpules  each containing 9 mg bupivacaine with 0.009 mg of epinephrine.  The Maxillary left and right quadrants first approached. Anesthesia was then delivered utilizing infiltration with lidocaine with epinephrine. A #15 blade incision was then made from the  maxillary right tuberosity and extended to the mesial #3. A surgical flap was then carefully reflected. Appropriate amounts of buccal and interseptal bone were then removed utilizing a surgical handpiece and bur and copious amounts of sterile water around tooth numbers 1 and 2. Tooth numbers 1 and 2 were then subluxated with a series straight elevators. Tooth numbers 1 and 2 were then removed without complication with a [id number] forceps. Alveoloplasty was then performed utilizing a rongeur and bone file. The surgical  site was then irrigated with copious amounts sterile saline. A piece of Surgifoam was placed in the extraction sockets appropriately. The surgical site was then closed from the maxillary right tuberosity and extended to the distal of #3 utilizing 3-0 chromic gut  suture in a continuous interrupted suture technique 1.  At this point time, the maxillary left surgical site was approached. 15 blade incision was then made from the maxillary left tuberosity and extended to the mesial #14. A surgical flap was then carefully reflected. Appropriate amounts of buccal and interseptal bone was removed around tooth #15 and 16 with a surgical handpiece and bur and copious most sterile water. Tooth numbers 15 and 16 were then subluxated with a series straight elevators. Tooth numbers 15 and 16 were then removed with a 53 mL forceps without complications. Alveoloplasty was then performed utilizing a rongeur and bone file. The surgical site was irrigated with copious amounts sterile saline. A piece of Surgifoam was placed in the extraction sockets appropriately. The surgical site was then closed from the maxillary left tuberosity and extended to the mesial #15 utilizing 3-0 chromic gut suture in a continuous interrupted suture technique 1.  At this point time, the mandibular quadrants were approached. The patient was given bilateral inferior alveolar nerve blocks and long buccal nerve blocks utilizing the bupivacaine with epinephrine. Further infiltration was then achieved utilizing the lidocaine with epinephrine. A 15 blade incision was then made from the distal of number 17 and extended to the mesial of #18. A surgical flap was then carefully reflected. Appropriate amounts of buccal bone were then removed around tooth number 17 with a surgical handpiece and bur and copious amounts sterile water. Tooth numbers 17 was then subluxated and removed with a 23 forceps without complications. Alveoloplasty was then performed utilizing a rongeur and bone file. Surgical site was irrigated with copious amounts of sterile saline. A piece of Surgifoam was placed in the extraction socket. The surgical site was then closed with 3-0 chromic gut material in a figure-of-eight suture technique.  At  this point time tooth #32 was approached. A 15 blade incision was made from the distal of #32 and extended the mesial #31. A surgical flap was then carefully reflected. Appropriate amounts of buccal and interseptal bone was removed around tooth #32. Tooth #32 subluxated with a series straight elevators and then removed with a 23 forceps without complications. Alveoloplasty was then performed utilizing a rongeur and bone file. The surgical site was then irrigated with copious amounts sterile saline. A piece of Surgifoam was placed in the extraction socket. The surgical site was then closed from the distal of #32 and extended to the distal of #31 utilizing 3-0 chromic gut suture in a figure-of-eight suture technique.    At this point time the remaining dentition was approached. A Cavitron was used to remove significant accretions. A sonic scaler was then used to further remove accretions. A series of selective curettes were utilized to further remove accretions. At this point time the gross debridement procedure was complete. The patient will need continued periodontal therapy with evaluation for scaling and root planing by the primary dentist of his choice.   At this point time, the entire mouth was irrigated with copious amounts of sterile saline. The patient was examined for complications, seeing none, the dental medicine procedure was deemed to be complete. The throat pack was removed at this time. An oral airway was then placed at the request of the anesthesia team. A series of  4 x 4 gauze were placed in the mouth to aid hemostasis. The patient was then handed over to the anesthesia team for final disposition. After an appropriate amount of time, the patient was extubated and taken to the postanesthsia care unit  in good condition. All counts were correct for the dental medicine procedure.Patient was given a prescription for Percocet 5/325. The patient is take 1-2 tablets by mouth every 4-6 hours as needed  for pain. Patient is to proceed with hemodialysis tomorrow with minimal use of heparin. Patient will be seen in 7-10 days for evaluation for suture removal.   Lenn Cal, DDS.

## 2015-01-28 NOTE — Progress Notes (Signed)
PRE-OPERATIVE NOTE:  01/28/2015   Charles [name] [id number]  VITALS: BP 156/77 mmHg  Pulse 70  Temp(Src) 98.7 F (37.1 C) (Oral)  Resp 18  Ht 5\' 11"  (1.803 m)  Wt 165 lb 5.5 oz (75 kg)  BMI 23.07 kg/m2  SpO2 100%  Lab Results  Component Value Date   WBC 5.6 08/29/2012   HGB 14.3 01/28/2015   HCT 42.0 01/28/2015   MCV 82.9 08/29/2012   PLT 162 08/29/2012   BMET    Component Value Date/Time   NA 137 01/28/2015 0640   K 4.7 01/28/2015 0640   CL 101 08/29/2012 1812   CO2 24 08/29/2012 1812   GLUCOSE 84 01/28/2015 0640   BUN 40* 08/29/2012 1812   CREATININE 4.08* 08/29/2012 1812   CALCIUM 8.8 08/29/2012 1812   GFRNONAA 15* 08/29/2012 1812   GFRAA 18* 08/29/2012 1812    No results found for: INR, PROTIME No results found for: PTT   Charles [name] presents for multiple dental extractions with alveoloplasty and gross debridement of remaining dentition in the operating room with general anesthesia as part of a medically necessary pre-kidney transplant dental protocol.   SUBJECTIVE: The patient denies any acute medical or dental changes and agrees to proceed with treatment as planned.  EXAM: No sign of acute dental changes.  ASSESSMENT: Patient is affected by chronic periodontitis, accretions, and tooth mobility.  PLAN: Patient agrees to proceed with treatment as planned in the operating room as previously discussed and accepts the risks, benefits, and complications of the proposed treatment. Patient is aware of the risk for bleeding, bruising, swelling, infection, pain, nerve damage, soft tissue damage, damage to adjacent teeth, sinus involvement, root tip fracture, mandible fracture, and the risks of complications associated with the anesthesia. Patient also is aware of the potential for other complications not mentioned above.   Lenn Cal, DDS

## 2015-01-29 ENCOUNTER — Encounter (HOSPITAL_COMMUNITY): Payer: Self-pay | Admitting: Dentistry

## 2015-02-02 ENCOUNTER — Ambulatory Visit (HOSPITAL_COMMUNITY): Payer: Self-pay | Admitting: Dentistry

## 2015-02-02 ENCOUNTER — Encounter (HOSPITAL_COMMUNITY): Payer: Self-pay | Admitting: Dentistry

## 2015-02-02 VITALS — BP 156/67 | HR 91 | Temp 98.2°F

## 2015-02-02 DIAGNOSIS — N186 End stage renal disease: Secondary | ICD-10-CM

## 2015-02-02 DIAGNOSIS — K08409 Partial loss of teeth, unspecified cause, unspecified class: Secondary | ICD-10-CM

## 2015-02-02 DIAGNOSIS — Z01818 Encounter for other preprocedural examination: Secondary | ICD-10-CM

## 2015-02-02 NOTE — Progress Notes (Signed)
POST OPERATIVE NOTE:  02/02/2015 Charles [name] [id number]  VITALS: BP 156/67 mmHg  Pulse 91  Temp(Src) 98.2 F (36.8 C) (Oral)  LABS:  Lab Results  Component Value Date   WBC 5.6 08/29/2012   HGB 14.3 01/28/2015   HCT 42.0 01/28/2015   MCV 82.9 08/29/2012   PLT 162 08/29/2012   BMET    Component Value Date/Time   NA 137 01/28/2015 0640   K 4.7 01/28/2015 0640   CL 101 08/29/2012 1812   CO2 24 08/29/2012 1812   GLUCOSE 84 01/28/2015 0640   BUN 40* 08/29/2012 1812   CREATININE 4.08* 08/29/2012 1812   CALCIUM 8.8 08/29/2012 1812   GFRNONAA 15* 08/29/2012 1812   GFRAA 18* 08/29/2012 1812    No results found for: INR, PROTIME No results found for: PTT   Charles [name] is status post multiple extractions with alveoloplasty and gross debridement of remaining dentition on 01/28/2015.   SUBJECTIVE: Patient with minimal complaints. Patient indicates that stitches are still present. Patient dialyzed yesterday.  EXAM: There is no sign of infection, heme, or ooze. Sutures are intact. Extraction sites will need to heal in by secondary intention. Some plaque noted and oral hygiene improvement was highly suggested.   ASSESSMENT: Post operative course is consistent with dental procedures performed in the OR.   PLAN: 1. Continue salt water rinses as needed to aid healing. 2. Return to dental medicine next Tuesday for evaluation of healing and suture removal as needed. 3. Use pain medication as needed. Call if problems arise before then.   Lenn Cal, DDS

## 2015-02-02 NOTE — Patient Instructions (Addendum)
PLAN: 1. Continue salt water rinses as needed to aid healing. 2. Return to dental medicine next Tuesday for evaluation of healing and suture removal as needed. 3. Use pain medication as needed. Call if problems arise before then.   Lenn Cal, DDS    MOUTH CARE AFTER SURGERY  FACTS:  Ice used in ice bag helps keep the swelling down, and can help lessen the pain.  It is easier to treat pain BEFORE it happens.  Spitting disturbs the clot and may cause bleeding to start again, or to get worse.  Smoking delays healing and can cause complications.  Sharing prescriptions can be dangerous.  Do not take medications not recently prescribed for you.  Antibiotics may stop birth control pills from working.  Use other means of birth control while on antibiotics.  Warm salt water rinses after the first 24 hours will help lessen the swelling:  Use 1/2 teaspoonful of table salt per oz.of water.  DO NOT:  Do not spit.  Do not drink through a straw.  Strongly advised not to smoke, dip snuff or chew tobacco at least for 3 days.  Do not eat sharp or crunchy foods.  Avoid the area of surgery when chewing.  Do not stop your antibiotics before your instructions say to do so.  Do not eat hot foods until bleeding has stopped.  If you need to, let your food cool down to room temperature.  EXPECT:  Some swelling, especially first 2-3 days.  Soreness or discomfort in varying degrees.  Follow your dentist's instructions about how to handle pain before it starts.  Pinkish saliva or light blood in saliva, or on your pillow in the morning.  This can last around 24 hours.  Bruising inside or outside the mouth.  This may not show up until 2-3 days after surgery.  Don't worry, it will go away in time.  Pieces of "bone" may work themselves loose.  It's OK.  If they bother you, let us know.  WHAT TO DO IMMEDIATELY AFTER SURGERY:  Bite on the gauze with steady pressure for 1-2 hours.  Don't  chew on the gauze.  Do not lie down flat.  Raise your head support especially for the first 24 hours.  Apply ice to your face on the side of the surgery.  You may apply it 20 minutes on and a few minutes off.  Ice for 8-12 hours.  You may use ice up to 24 hours.  Before the numbness wears off, take a pain pill as instructed.  Prescription pain medication is not always required.  SWELLING:  Expect swelling for the first couple of days.  It should get better after that.  If swelling increases 3 days or so after surgery; let us know as soon as possible.  FEVER:  Take Tylenol every 4 hours if needed to lower your temperature, especially if it is at 100F or higher.  Drink lots of fluids.  If the fever does not go away, let us know.  BREATHING TROUBLE:  Any unusual difficulty breathing means you have to have someone bring you to the emergency room ASAP  BLEEDING:  Light oozing is expected for 24 hours or so.  Prop head up with pillows  Avoid spitting  Do not confuse bright red fresh flowing blood with lots of saliva colored with a little bit of blood.  If you notice some bleeding, place gauze or a tea bag where it is bleeding and apply CONSTANT pressure  by biting down for 1 hour.  Avoid talking during this time.  Do not remove the gauze or tea bag during this hour to "check" the bleeding.  If you notice bright RED bleeding FLOWING out of particular area, and filling the floor of your mouth, put a wad of gauze on that area, bite down firmly and constantly.  Call us immediately.  If we're closed, have someone bring you to the emergency room.  ORAL HYGIENE:  Brush your teeth as usual after meals and before bedtime.  Use a soft toothbrush around the area of surgery.  DO NOT AVOID BRUSHING.  Otherwise bacteria(germs) will grow and may delay healing or encourage infection.  Since you cannot spit, just gently rinse and let the water flow out of your mouth.  DO NOT SWISH  HARD.  EATING:  Cool liquids are a good point to start.  Increase to soft foods as tolerated.  PRESCRIPTIONS:  Follow the directions for your prescriptions exactly as written.  If Dr. Enrique Sack gave you a narcotic pain medication, do not drive, operate machinery or drink alcohol when on that medication.  QUESTIONS:  Call our office during office hours [phone] or call the Emergency Room at [phone].

## 2015-02-09 ENCOUNTER — Telehealth (HOSPITAL_COMMUNITY): Payer: Self-pay

## 2015-02-09 ENCOUNTER — Ambulatory Visit (HOSPITAL_COMMUNITY): Payer: Self-pay | Admitting: Dentistry

## 2015-02-09 NOTE — Telephone Encounter (Signed)
02/09/15    Patient called to cancel F/U appt. w/Dr. Enrique Sack on day of appt. 02/09/15 @ 9:45.   Patient stated he would call Dental Medicine back to reschedule.  LRI

## 2015-02-11 ENCOUNTER — Ambulatory Visit (AMBULATORY_SURGERY_CENTER): Payer: Self-pay

## 2015-02-11 VITALS — Ht 71.0 in | Wt 165.0 lb

## 2015-02-11 DIAGNOSIS — Z1211 Encounter for screening for malignant neoplasm of colon: Secondary | ICD-10-CM

## 2015-02-11 MED ORDER — PEG-KCL-NACL-NASULF-NA ASC-C 100 G PO SOLR
1.0000 | Freq: Once | ORAL | Status: DC
Start: 2015-02-11 — End: 2015-02-24

## 2015-02-11 NOTE — Progress Notes (Signed)
No hx of anesthesia complications Not on home 02 No egg or soy allergies Emmi video sent to [email or web address]

## 2015-02-16 ENCOUNTER — Ambulatory Visit (HOSPITAL_COMMUNITY): Payer: Self-pay | Admitting: Dentistry

## 2015-02-16 ENCOUNTER — Encounter (HOSPITAL_COMMUNITY): Payer: Self-pay | Admitting: Dentistry

## 2015-02-16 VITALS — BP 132/64 | HR 70 | Temp 98.4°F

## 2015-02-16 DIAGNOSIS — Z01818 Encounter for other preprocedural examination: Secondary | ICD-10-CM

## 2015-02-16 DIAGNOSIS — K036 Deposits [accretions] on teeth: Secondary | ICD-10-CM

## 2015-02-16 DIAGNOSIS — K08409 Partial loss of teeth, unspecified cause, unspecified class: Secondary | ICD-10-CM

## 2015-02-16 DIAGNOSIS — K0530 Chronic periodontitis, unspecified: Secondary | ICD-10-CM

## 2015-02-16 DIAGNOSIS — N186 End stage renal disease: Secondary | ICD-10-CM

## 2015-02-16 NOTE — Patient Instructions (Signed)
PLAN: 1. Continue salt water rinses as needed to aid healing. 2. Patient to follow-up with a primary dentist of his choice for regular periodontal maintenance procedures. 3. The patient is currently cleared for kidney transplant procedure.   Lenn Cal, DDS

## 2015-02-16 NOTE — Progress Notes (Signed)
PROGRESS NOTE:  02/16/2015   Charles [name] [id number]  VITALS: BP 132/64 mmHg  Pulse 70  Temp(Src) 98.4 F (36.9 C) (Oral)  LABS:  Lab Results  Component Value Date   WBC 5.6 08/29/2012   HGB 14.3 01/28/2015   HCT 42.0 01/28/2015   MCV 82.9 08/29/2012   PLT 162 08/29/2012   BMET    Component Value Date/Time   NA 137 01/28/2015 0640   K 4.7 01/28/2015 0640   CL 101 08/29/2012 1812   CO2 24 08/29/2012 1812   GLUCOSE 84 01/28/2015 0640   BUN 40* 08/29/2012 1812   CREATININE 4.08* 08/29/2012 1812   CALCIUM 8.8 08/29/2012 1812   GFRNONAA 15* 08/29/2012 1812   GFRAA 18* 08/29/2012 1812    No results found for: INR, PROTIME No results found for: PTT   Charles [name] is status post multiple extractions with alveoloplasty and gross debridement of remaining dentition on 01/28/2015. Patient was seen on 02/02/2015 for evaluation for suture removal and healing. Patient now presents for reevaluation of healing of the upper right and upper left molar extraction sites.   SUBJECTIVE: Patient with minimal complaints. Patient denies having any problems with the extraction sites.   EXAM: There is no sign of infection, heme, or ooze. Maxillary right and maxillary left molar extraction sites are healing in by secondary intention. Valsalva maneuver was negative.  Mandibular extraction sites are healing in by primary closure. Some plaque noted and oral hygiene improvement was again highly suggested.   ASSESSMENT: Post operative course is consistent with dental procedures performed in the OR. Maxillary right and maxillary left molar extraction sites continue to heal in by secondary intention.  PLAN: 1. Continue salt water rinses as needed to aid healing. 2. Patient to follow-up with a primary dentist of his choice regular periodontal maintenance procedures. 3. The patient is currently cleared for kidney transplant procedure.   Lenn Cal, DDS

## 2015-02-22 ENCOUNTER — Telehealth: Payer: Self-pay | Admitting: Internal Medicine

## 2015-02-22 NOTE — Telephone Encounter (Signed)
Pt called to say his insurance wouldn't cover Moviprep. Advised patient to come to previsit for new instructions with Miralax prep. Pt said he would be here before 2:00pm.

## 2015-02-24 ENCOUNTER — Ambulatory Visit (AMBULATORY_SURGERY_CENTER): Payer: BLUE CROSS/BLUE SHIELD | Admitting: Internal Medicine

## 2015-02-24 ENCOUNTER — Encounter: Payer: Self-pay | Admitting: Internal Medicine

## 2015-02-24 VITALS — BP 154/83 | HR 65 | Temp 96.8°F | Resp 34 | Ht 71.0 in | Wt 165.0 lb

## 2015-02-24 DIAGNOSIS — Z1211 Encounter for screening for malignant neoplasm of colon: Secondary | ICD-10-CM

## 2015-02-24 LAB — GLUCOSE, CAPILLARY
GLUCOSE-CAPILLARY: 68 mg/dL (ref 65–99)
GLUCOSE-CAPILLARY: 67 mg/dL (ref 65–99)
GLUCOSE-CAPILLARY: 92 mg/dL (ref 65–99)
Glucose-Capillary: 79 mg/dL (ref 65–99)
Glucose-Capillary: 85 mg/dL (ref 65–99)

## 2015-02-24 MED ORDER — SODIUM CHLORIDE 0.9 % IV SOLN: 500.0000 mL | INTRAVENOUS | Status: DC

## 2015-02-24 NOTE — Progress Notes (Signed)
Pt's blood sugar was 68 on admission to the recovery room. Iv d5w open drip.  Retook in 15 minutes blood sugar was 67.  Pt awake and gave him juice and graham cracker with peanut butter.  maw

## 2015-02-24 NOTE — Progress Notes (Signed)
Transferred to recovery room. A/O x3, pleased with MAC.  VSS.  Report to Annette, RN. 

## 2015-02-24 NOTE — Patient Instructions (Addendum)
YOU HAD AN ENDOSCOPIC PROCEDURE TODAY AT [facility]:   Refer to the procedure report that was given to you for any specific questions about what was found during the examination.  If the procedure report does not answer your questions, please call your gastroenterologist to clarify.  If you requested that your care partner not be given the details of your procedure findings, then the procedure report has been included in a sealed envelope for you to review at your convenience later.  YOU SHOULD EXPECT: Some feelings of bloating in the abdomen. Passage of more gas than usual.  Walking can help get rid of the air that was put into your GI tract during the procedure and reduce the bloating. If you had a lower endoscopy (such as a colonoscopy or flexible sigmoidoscopy) you may notice spotting of blood in your stool or on the toilet paper. If you underwent a bowel prep for your procedure, you may not have a normal bowel movement for a few days.  Please Note:  You might notice some irritation and congestion in your nose or some drainage.  This is from the oxygen used during your procedure.  There is no need for concern and it should clear up in a day or so.  SYMPTOMS TO REPORT IMMEDIATELY:   Following lower endoscopy (colonoscopy or flexible sigmoidoscopy):  Excessive amounts of blood in the stool  Significant tenderness or worsening of abdominal pains  Swelling of the abdomen that is new, acute  Fever of 100F or higher   For urgent or emergent issues, a gastroenterologist can be reached at any hour by calling [phone].   DIET: Your first meal following the procedure should be a small meal and then it is ok to progress to your normal diet. Heavy or fried foods are harder to digest and may make you feel nauseous or bloated.  Likewise, meals heavy in dairy and vegetables can increase bloating.  Drink plenty of fluids but you should avoid alcoholic beverages for 24  hours.  ACTIVITY:  You should plan to take it easy for the rest of today and you should NOT DRIVE or use heavy machinery until tomorrow (because of the sedation medicines used during the test).    FOLLOW UP: Our staff will call the number listed on your records the next business day following your procedure to check on you and address any questions or concerns that you may have regarding the information given to you following your procedure. If we do not reach you, we will leave a message.  However, if you are feeling well and you are not experiencing any problems, there is no need to return our call.  We will assume that you have returned to your regular daily activities without incident.  If any biopsies were taken you will be contacted by phone or by letter within the next 1-3 weeks.  Please call us at [phone] if you have not heard about the biopsies in 3 weeks.    SIGNATURES/CONFIDENTIALITY: You and/or your care partner have signed paperwork which will be entered into your electronic medical record.  These signatures attest to the fact that that the information above on your After Visit Summary has been reviewed and is understood.  Full responsibility of the confidentiality of this discharge information lies with you and/or your care-partner.    Handout was given to your care partner on diverticulosis . You may resume your current medications today. Please call if any questions  or concerns.   

## 2015-02-24 NOTE — Progress Notes (Addendum)
PT. CBG =79, denies s/s stated that his CBG Runs 90-120. Dr. Hilarie Fredrickson made aware and instructed to give [id number] CBG rechecked =85.

## 2015-02-24 NOTE — Op Note (Signed)
[facility]  [address] [place], [id number]   COLONOSCOPY PROCEDURE REPORT  PATIENT: Charles [name], Charles [name]  MR#: [id number] BIRTHDATE: 12-11-1955 , 89  yrs. old GENDER: male ENDOSCOPIST: Jerene Bears, MD PROCEDURE DATE:  02/24/2015 PROCEDURE:   Colonoscopy, screening First Screening Colonoscopy - Avg.  risk and is 50 yrs.  old or older Yes.  Prior Negative Screening - Now for repeat screening. N/A  History of Adenoma - Now for follow-up colonoscopy & has been > or = to 3 yrs.  N/A  Polyps removed today? No Recommend repeat exam, <10 yrs? No ASA CLASS:   Class III INDICATIONS:Screening for colonic neoplasia and average risk patient for colon cancer. MEDICATIONS: Monitored anesthesia care and Propofol 275 mg IV  DESCRIPTION OF PROCEDURE:   After the risks benefits and alternatives of the procedure were thoroughly explained, informed consent was obtained.  The digital rectal exam revealed no rectal mass.   The LB [id number]  endoscope was introduced through the anus and advanced to the cecum, which was identified by both the appendix and ileocecal valve. No adverse events experienced. The quality of the prep was fair requiring copious irrigation and lavage.  (MiraLax was used)  The instrument was then slowly withdrawn as the colon was fully examined. Estimated blood loss is zero unless otherwise noted in this procedure report.    COLON FINDINGS: There was mild diverticulosis noted in the ascending colon and left colon.   A normal appearing cecum, ileocecal valve, and appendiceal orifice were identified.  The ascending, transverse, descending, sigmoid colon, and rectum appeared unremarkable.  Retroflexed views revealed internal hemorrhoids. The time to cecum = 5.8 Withdrawal time = 9.6   The scope was withdrawn and the procedure completed. COMPLICATIONS: There were no immediate complications.  ENDOSCOPIC IMPRESSION: 1.   Mild diverticulosis was noted in  the ascending colon and left colon 2.   Otherwise normal colonoscopy  RECOMMENDATIONS: 1.  High fiber diet 2.  You should continue to follow colorectal cancer screening guidelines for "routine risk" patients with a repeat colonoscopy in 10 years.  There is no need for FOBT (stool) testing for at least 5 years.  eSigned:  Jerene Bears, MD 02/24/2015 4:04 PM   cc: the patient, PCP

## 2015-02-24 NOTE — Progress Notes (Signed)
Pt blood sugar was 92 after juice and cracker with peanut butter.  Pt ready for discharge.  No complaints noted.  Pt requested extra copy of his report for his MD at [facility] that he is seeing for possible transplant.  Dr. Hilarie Fredrickson advised ok for the pt to have dialyisis tomorrow. maw

## 2015-02-25 ENCOUNTER — Telehealth: Payer: Self-pay

## 2015-02-25 NOTE — Telephone Encounter (Signed)
Left a message at [phone] and the ID his name on the recording.  Pt to call us back if any questions or concerns. maw

## 2015-05-05 DIAGNOSIS — R972 Elevated prostate specific antigen [PSA]: Secondary | ICD-10-CM | POA: Insufficient documentation

## 2015-08-06 DIAGNOSIS — Z227 Latent tuberculosis: Secondary | ICD-10-CM | POA: Insufficient documentation

## 2016-10-16 ENCOUNTER — Encounter (HOSPITAL_COMMUNITY): Payer: Self-pay | Admitting: Emergency Medicine

## 2016-10-16 ENCOUNTER — Inpatient Hospital Stay (HOSPITAL_COMMUNITY)
Admission: EM | Admit: 2016-10-16 | Discharge: 2016-10-19 | DRG: 871 | Disposition: A | Discharge status: Home / Self Care | Payer: Medicare Other | Attending: Internal Medicine | Admitting: Internal Medicine

## 2016-10-16 ENCOUNTER — Emergency Department (HOSPITAL_COMMUNITY): Payer: Medicare Other

## 2016-10-16 DIAGNOSIS — IMO0001 Reserved for inherently not codable concepts without codable children: Secondary | ICD-10-CM

## 2016-10-16 DIAGNOSIS — Z794 Long term (current) use of insulin: Secondary | ICD-10-CM

## 2016-10-16 DIAGNOSIS — N2581 Secondary hyperparathyroidism of renal origin: Secondary | ICD-10-CM | POA: Diagnosis present

## 2016-10-16 DIAGNOSIS — N3001 Acute cystitis with hematuria: Secondary | ICD-10-CM | POA: Diagnosis not present

## 2016-10-16 DIAGNOSIS — E119 Type 2 diabetes mellitus without complications: Secondary | ICD-10-CM

## 2016-10-16 DIAGNOSIS — E1165 Type 2 diabetes mellitus with hyperglycemia: Secondary | ICD-10-CM | POA: Diagnosis present

## 2016-10-16 DIAGNOSIS — N390 Urinary tract infection, site not specified: Secondary | ICD-10-CM | POA: Diagnosis present

## 2016-10-16 DIAGNOSIS — E1121 Type 2 diabetes mellitus with diabetic nephropathy: Secondary | ICD-10-CM | POA: Diagnosis present

## 2016-10-16 DIAGNOSIS — H409 Unspecified glaucoma: Secondary | ICD-10-CM | POA: Diagnosis present

## 2016-10-16 DIAGNOSIS — E1122 Type 2 diabetes mellitus with diabetic chronic kidney disease: Secondary | ICD-10-CM | POA: Diagnosis present

## 2016-10-16 DIAGNOSIS — N186 End stage renal disease: Secondary | ICD-10-CM | POA: Diagnosis present

## 2016-10-16 DIAGNOSIS — E785 Hyperlipidemia, unspecified: Secondary | ICD-10-CM | POA: Diagnosis present

## 2016-10-16 DIAGNOSIS — E8889 Other specified metabolic disorders: Secondary | ICD-10-CM | POA: Diagnosis present

## 2016-10-16 DIAGNOSIS — Z833 Family history of diabetes mellitus: Secondary | ICD-10-CM

## 2016-10-16 DIAGNOSIS — R6520 Severe sepsis without septic shock: Secondary | ICD-10-CM | POA: Diagnosis present

## 2016-10-16 DIAGNOSIS — D631 Anemia in chronic kidney disease: Secondary | ICD-10-CM | POA: Diagnosis present

## 2016-10-16 DIAGNOSIS — I10 Essential (primary) hypertension: Secondary | ICD-10-CM | POA: Diagnosis present

## 2016-10-16 DIAGNOSIS — Z992 Dependence on renal dialysis: Secondary | ICD-10-CM

## 2016-10-16 DIAGNOSIS — R000 Tachycardia, unspecified: Secondary | ICD-10-CM

## 2016-10-16 DIAGNOSIS — B9620 Unspecified Escherichia coli [E. coli] as the cause of diseases classified elsewhere: Secondary | ICD-10-CM | POA: Diagnosis present

## 2016-10-16 DIAGNOSIS — Z79899 Other long term (current) drug therapy: Secondary | ICD-10-CM

## 2016-10-16 DIAGNOSIS — E7800 Pure hypercholesterolemia, unspecified: Secondary | ICD-10-CM | POA: Diagnosis present

## 2016-10-16 DIAGNOSIS — I120 Hypertensive chronic kidney disease with stage 5 chronic kidney disease or end stage renal disease: Secondary | ICD-10-CM | POA: Diagnosis present

## 2016-10-16 DIAGNOSIS — A419 Sepsis, unspecified organism: Secondary | ICD-10-CM | POA: Diagnosis not present

## 2016-10-16 DIAGNOSIS — R509 Fever, unspecified: Secondary | ICD-10-CM

## 2016-10-16 LAB — URINALYSIS, ROUTINE W REFLEX MICROSCOPIC
Bilirubin Urine: NEGATIVE
GLUCOSE, UA: NEGATIVE mg/dL
KETONES UR: NEGATIVE mg/dL
Nitrite: NEGATIVE
PROTEIN: 100 mg/dL — AB
SQUAMOUS EPITHELIAL / LPF: NONE SEEN
Specific Gravity, Urine: 1.011 (ref 1.005–1.030)
pH: 8 (ref 5.0–8.0)

## 2016-10-16 LAB — COMPREHENSIVE METABOLIC PANEL
ALK PHOS: 168 U/L — AB (ref 38–126)
ALT: 37 U/L (ref 17–63)
AST: 32 U/L (ref 15–41)
Albumin: 3.2 g/dL — ABNORMAL LOW (ref 3.5–5.0)
Anion gap: 17 — ABNORMAL HIGH (ref 5–15)
BILIRUBIN TOTAL: 0.7 mg/dL (ref 0.3–1.2)
BUN: 23 mg/dL — AB (ref 6–20)
CALCIUM: 8.7 mg/dL — AB (ref 8.9–10.3)
CO2: 29 mmol/L (ref 22–32)
CREATININE: 8.18 mg/dL — AB (ref 0.61–1.24)
Chloride: 90 mmol/L — ABNORMAL LOW (ref 101–111)
GFR calc Af Amer: 7 mL/min — ABNORMAL LOW (ref >60)
GFR calc non Af Amer: 6 mL/min — ABNORMAL LOW (ref >60)
GLUCOSE: 189 mg/dL — AB (ref 65–99)
Potassium: 4.2 mmol/L (ref 3.5–5.1)
Sodium: 136 mmol/L (ref 135–145)
TOTAL PROTEIN: 7.8 g/dL (ref 6.5–8.1)

## 2016-10-16 LAB — CBC WITH DIFFERENTIAL/PLATELET
BASOS PCT: 0 %
Basophils Absolute: 0 10*3/uL (ref 0.0–0.1)
EOS ABS: 0.1 10*3/uL (ref 0.0–0.7)
Eosinophils Relative: 1 %
HEMATOCRIT: 37.6 % — AB (ref 39.0–52.0)
HEMOGLOBIN: 12.6 g/dL — AB (ref 13.0–17.0)
LYMPHS ABS: 0.8 10*3/uL (ref 0.7–4.0)
Lymphocytes Relative: 9 %
MCH: 30.8 pg (ref 26.0–34.0)
MCHC: 33.5 g/dL (ref 30.0–36.0)
MCV: 91.9 fL (ref 78.0–100.0)
MONOS PCT: 7 %
Monocytes Absolute: 0.6 10*3/uL (ref 0.1–1.0)
NEUTROS ABS: 7.2 10*3/uL (ref 1.7–7.7)
NEUTROS PCT: 83 %
Platelets: 164 10*3/uL (ref 150–400)
RBC: 4.09 MIL/uL — AB (ref 4.22–5.81)
RDW: 14.7 % (ref 11.5–15.5)
WBC: 8.6 10*3/uL (ref 4.0–10.5)

## 2016-10-16 LAB — CBG MONITORING, ED: Glucose-Capillary: 165 mg/dL — ABNORMAL HIGH (ref 65–99)

## 2016-10-16 LAB — I-STAT CG4 LACTIC ACID, ED: Lactic Acid, Venous: 1.98 mmol/L (ref 0.5–1.9)

## 2016-10-16 LAB — PROTIME-INR
INR: 1.05
PROTHROMBIN TIME: 13.7 s (ref 11.4–15.2)

## 2016-10-16 LAB — LIPASE, BLOOD: Lipase: 29 U/L (ref 11–51)

## 2016-10-16 MED ORDER — DEXTROSE 5 % IV SOLN
1.0000 g | Freq: Once | INTRAVENOUS | Status: AC
  Administered 2016-10-17: 1 g via INTRAVENOUS
  Filled 2016-10-16: qty 10

## 2016-10-16 MED ORDER — SODIUM CHLORIDE 0.9 % IV BOLUS (SEPSIS)
500.0000 mL | Freq: Once | INTRAVENOUS | Status: AC
  Administered 2016-10-17: 500 mL via INTRAVENOUS

## 2016-10-16 MED ORDER — ACETAMINOPHEN 325 MG PO TABS
650.0000 mg | ORAL_TABLET | Freq: Once | ORAL | Status: AC
  Administered 2016-10-16: 650 mg via ORAL
  Filled 2016-10-16: qty 2

## 2016-10-16 NOTE — ED Triage Notes (Signed)
Pt in via [facility] from dialysis center. Pre dialysis HR was 85 and post dialysis went up to 120. Denies any CP or n/v/d @ this time. Hx of diabetes. ST on the monitor.

## 2016-10-16 NOTE — ED Provider Notes (Signed)
[facility] DEPT Provider Note   CSN: [id number] Arrival date & time: 10/16/16  2056     History   Chief Complaint Chief Complaint  Patient presents with  . Tachycardia    HPI SEDDRICK FLAX is a 61 y.o. male With a past medical history significant for hypertension, ESRD on dialysis, DM and prostate biopsy five days ago who presents from his dialysis center for tachycardia, malaise, fevers, chills, myalgias, dysuria, and hematuria. Patient reports that five days ago, he had his prostate biopsy through a rectal ultrasound biopsy. He was given three days of antibiotics and told he may have hematuria after this. Patient reports persistent hematuria and he has developed dysuria despite taking three days of antibiotics. The reports that he went to his dialysis today and it was stopped early due to his tachycardia and malaise. Patient was transferred for further management. Patient denies of respiratory symptoms including no cough, congestion, or chest pain. He denies leg pain, abdominal pain, or other symptoms. On arrival, he was tachycardic in the 120s.    The history is provided by the patient, the spouse and medical records.  Fever   This is a new problem. The current episode started yesterday. The problem occurs constantly. The problem has not changed since onset.The maximum temperature noted was 102 to 102.9 F. The temperature was taken using an oral thermometer. Associated symptoms include muscle aches. Pertinent negatives include no chest pain, no diarrhea, no vomiting, no congestion, no headaches, no sore throat and no cough. He has tried nothing for the symptoms. The treatment provided no relief.    Past Medical History:  Diagnosis Date  . Anemia   . Diabetes mellitus without complication (HCC)    Type 2   . End stage kidney disease ([place])    HD   M-W-F  . Glaucoma   . High cholesterol   . Hypertension     Patient Active Problem List   Diagnosis Date Noted  . Chronic  periodontitis 01/28/2015  . End stage renal disease ([place]) 10/03/2013  . ARF (acute renal failure) ([place]) 05/06/2013  . RENAL INSUFFICIENCY, ACUTE 08/19/2009  . APPENDECTOMY, HX OF 09/11/2007  . ERECTILE DYSFUNCTION 07/15/2007  . PROTEINURIA 07/15/2007  . ANEMIA 06/17/2007  . DIABETES MELLITUS, TYPE II 05/20/2007  . HYPERLIPIDEMIA 05/20/2007  . HYPERTENSION 05/20/2007    Past Surgical History:  Procedure Laterality Date  . APPENDECTOMY    . AV FISTULA PLACEMENT Right 05/19/2013   Procedure: RIGHT BRACHIOCEPHALIC  ARTERIOVENOUS (AV) FISTULA CREATION;  Surgeon: Conrad Winnebago, MD;  Location: [facility];  Service: Vascular;  Laterality: Right;  . COLONOSCOPY    . EYE SURGERY    . MULTIPLE EXTRACTIONS WITH ALVEOLOPLASTY N/A 01/28/2015   Procedure: Extraction of tooth #'s 4,0,98,11,91,47 with alveoloplasty and gross debridement of remaining teeth.;  Surgeon: Lenn Cal, DDS;  Location: [facility];  Service: Oral Surgery;  Laterality: N/A;       Home Medications    Prior to Admission medications   Medication Sig Start Date End Date Taking? Authorizing Provider  acetaminophen (TYLENOL) 500 MG tablet Take 1,000 mg by mouth every 6 (six) hours as needed.   Yes Historical Provider, MD  insulin glargine (LANTUS) 100 UNIT/ML injection Inject 25 Units into the skin at bedtime.   Yes Historical Provider, MD  latanoprost (XALATAN) 0.005 % ophthalmic solution Place 1 drop into both eyes at bedtime.  02/04/15  Yes Historical Provider, MD  multivitamin (RENA-VIT) TABS tablet Take 1 tablet by  mouth daily.   Yes Historical Provider, MD  prednisoLONE acetate (PRED FORTE) 1 % ophthalmic suspension Place 1 drop into both eyes at bedtime.  01/16/15  Yes Historical Provider, MD  sevelamer carbonate (RENVELA) 800 MG tablet Take 800 mg by mouth 3 (three) times daily with meals.    Yes Historical Provider, MD  amLODipine (NORVASC) 10 MG tablet Take 5 mg by mouth at bedtime.  09/18/13   Historical Provider, MD  b  complex-vitamin c-folic acid (NEPHRO-VITE) 0.8 MG TABS tablet Take 4 tablets by mouth 3 (three) times daily after meals.    Historical Provider, MD  BAYER CONTOUR TEST test strip  01/28/15   Historical Provider, MD  cinacalcet (SENSIPAR) 60 MG tablet Take 60 mg by mouth every Monday, Wednesday, and Friday with hemodialysis.     Historical Provider, MD  GMATE LANCETS 30G [place]  01/28/15   Historical Provider, MD  hydrALAZINE (APRESOLINE) 50 MG tablet Take 50 mg by mouth 2 (two) times daily.  09/18/13   Historical Provider, MD  labetalol (NORMODYNE) 200 MG tablet 400 mg 2 (two) times daily.  09/18/13   Historical Provider, MD  Lancet Devices (ADJUSTABLE LANCING DEVICE) [place]  01/28/15   Historical Provider, MD  Multiple Vitamin (MULTIVITAMIN WITH MINERALS) TABS tablet Take 1 tablet by mouth daily.    Historical Provider, MD  ofloxacin (OCUFLOX) 0.3 % ophthalmic solution  02/04/15   Historical Provider, MD  oxyCODONE-acetaminophen (PERCOCET) 5-325 MG per tablet Take one or two tablets by mouth every 4-6 hours as needed for pain. Patient not taking: Reported on 02/24/2015 01/28/15   Lenn Cal, DDS  timolol (TIMOPTIC) 0.5 % ophthalmic solution Place 1 drop into the right eye 3 (three) times daily.  07/11/13   Historical Provider, MD    Family History Family History  Problem Relation Age of Onset  . Diabetes Mother   . Colon cancer Neg Hx     Social History Social History  Substance Use Topics  . Smoking status: Never Smoker  . Smokeless tobacco: Never Used  . Alcohol use No     Allergies   Patient has no known allergies.   Review of Systems Review of Systems  Constitutional: Positive for chills and fever. Negative for activity change, diaphoresis and fatigue.  HENT: Negative for congestion, rhinorrhea and sore throat.   Eyes: Negative for visual disturbance.  Respiratory: Negative for cough, chest tightness, shortness of breath, wheezing and stridor.   Cardiovascular: Negative for  chest pain, palpitations and leg swelling.  Gastrointestinal: Negative for abdominal distention, abdominal pain, blood in stool, constipation, diarrhea, nausea and vomiting.  Genitourinary: Positive for dysuria and hematuria. Negative for difficulty urinating and flank pain.  Musculoskeletal: Positive for myalgias. Negative for back pain, gait problem, neck pain and neck stiffness.  Skin: Negative for rash and wound.  Neurological: Negative for dizziness, weakness, light-headedness and headaches.  Psychiatric/Behavioral: Negative for agitation.  All other systems reviewed and are negative.    Physical Exam Updated Vital Signs Pulse (!) 115   Temp (!) 102.1 F (38.9 C) (Oral)   Resp 17   Ht 5\' 10"  (1.778 m)   Wt 171 lb 15.3 oz (78 kg)   SpO2 98%   BMI 24.67 kg/m   Physical Exam  Constitutional: He is oriented to person, place, and time. He appears well-developed and well-nourished. No distress.  HENT:  Head: Normocephalic and atraumatic.  Right Ear: External ear normal.  Left Ear: External ear normal.  Nose: Nose normal.  Mouth/Throat: Oropharynx is clear and moist. No oropharyngeal exudate.  Eyes: Conjunctivae and EOM are normal. Pupils are equal, round, and reactive to light.  Neck: Normal range of motion. Neck supple.  Cardiovascular: Regular rhythm, normal heart sounds and intact distal pulses.  Tachycardia present.   No murmur heard. Pulmonary/Chest: Effort normal and breath sounds normal. No stridor. No respiratory distress. He has no wheezes. He exhibits no tenderness.  Abdominal: Soft. There is no tenderness. There is no rebound, no guarding and no CVA tenderness.  Musculoskeletal: He exhibits no edema or tenderness.  Neurological: He is alert and oriented to person, place, and time. He displays normal reflexes. No cranial nerve deficit. He exhibits normal muscle tone. Coordination normal.  Skin: Skin is warm. Capillary refill takes less than 2 seconds. No rash noted. He  is not diaphoretic. No erythema. No pallor.  Psychiatric: He has a normal mood and affect.  Nursing note and vitals reviewed.    ED Treatments / Results  Labs (all labs ordered are listed, but only abnormal results are displayed) Labs Reviewed  CBC WITH DIFFERENTIAL/PLATELET - Abnormal; Notable for the following:       Result Value   RBC 4.09 (*)    Hemoglobin 12.6 (*)    HCT 37.6 (*)    All other components within normal limits  COMPREHENSIVE METABOLIC PANEL - Abnormal; Notable for the following:    Chloride 90 (*)    Glucose, Bld 189 (*)    BUN 23 (*)    Creatinine, Ser 8.18 (*)    Calcium 8.7 (*)    Albumin 3.2 (*)    Alkaline Phosphatase 168 (*)    GFR calc non Af Amer 6 (*)    GFR calc Af Amer 7 (*)    Anion gap 17 (*)    All other components within normal limits  URINALYSIS, ROUTINE W REFLEX MICROSCOPIC - Abnormal; Notable for the following:    Color, Urine RED (*)    APPearance CLOUDY (*)    Hgb urine dipstick LARGE (*)    Protein, ur 100 (*)    Leukocytes, UA LARGE (*)    Bacteria, UA FEW (*)    Non Squamous Epithelial 0-5 (*)    All other components within normal limits  BASIC METABOLIC PANEL - Abnormal; Notable for the following:    Sodium 133 (*)    Potassium 5.5 (*)    Chloride 93 (*)    Glucose, Bld 242 (*)    BUN 30 (*)    Creatinine, Ser 9.45 (*)    Calcium 7.5 (*)    GFR calc non Af Amer 5 (*)    GFR calc Af Amer 6 (*)    All other components within normal limits  CBC - Abnormal; Notable for the following:    RBC 3.64 (*)    Hemoglobin 11.0 (*)    HCT 33.9 (*)    Platelets 135 (*)    All other components within normal limits  CBG MONITORING, ED - Abnormal; Notable for the following:    Glucose-Capillary 165 (*)    All other components within normal limits  I-STAT CG4 LACTIC ACID, ED - Abnormal; Notable for the following:    Lactic Acid, Venous 1.98 (*)    All other components within normal limits  URINE CULTURE  CULTURE, BLOOD (ROUTINE X 2)   CULTURE, BLOOD (ROUTINE X 2)  LIPASE, BLOOD  PROTIME-INR  PROCALCITONIN  HIV ANTIBODY (ROUTINE TESTING)  I-STAT CG4 LACTIC ACID, ED  EKG  EKG Interpretation  Date/Time:  Monday October 16 2016 21:03:20 EDT Ventricular Rate:  119 PR Interval:    QRS Duration: 88 QT Interval:  319 QTC Calculation: 449 R Axis:   8 Text Interpretation:  Sinus tachycardia Biatrial enlargement when compared to prior, T waves sharper may suggest hyperkalemia.  No STEMI Confirmed by Sherry Ruffing MD, CHRISTOPHER [phone]) on 10/16/2016 9:25:14 PM       Radiology Dg Chest 2 View  Result Date: 10/16/2016 CLINICAL DATA:  61 year old male with tachycardia. EXAM: CHEST  2 VIEW COMPARISON:  Chest radiograph dated 05/16/2013 FINDINGS: The heart size and mediastinal contours are within normal limits. Both lungs are clear. The visualized skeletal structures are unremarkable. IMPRESSION: No active cardiopulmonary disease. Electronically Signed   By: Anner Crete M.D.   On: 10/16/2016 22:50    Procedures Procedures (including critical care time)  Medications Ordered in ED Medications  vancomycin (VANCOCIN) IVPB 750 mg/150 ml premix (not administered)  acetaminophen (TYLENOL) tablet 1,000 mg (1,000 mg Oral Given 10/17/16 9629)  multivitamin (RENA-VIT) tablet 1 tablet (not administered)  prednisoLONE acetate (PRED FORTE) 1 % ophthalmic suspension 1 drop (not administered)  insulin glargine (LANTUS) injection 20 Units (not administered)  sevelamer carbonate (RENVELA) tablet 800 mg (800 mg Oral Given 10/17/16 0834)  hydrALAZINE (APRESOLINE) injection 5 mg (not administered)  insulin aspart (novoLOG) injection 0-9 Units (2 Units Subcutaneous Given 10/17/16 0837)  sodium chloride flush (NS) 0.9 % injection 3 mL (not administered)  ondansetron (ZOFRAN) tablet 4 mg (not administered)    Or  ondansetron (ZOFRAN) injection 4 mg (not administered)  cefTAZidime (FORTAZ) 2 g in dextrose 5 % 50 mL IVPB (not administered)    cefTAZidime (FORTAZ) 2 g in dextrose 5 % 50 mL IVPB (not administered)  acetaminophen (TYLENOL) tablet 650 mg (650 mg Oral Given 10/16/16 2211)  cefTRIAXone (ROCEPHIN) 1 g in dextrose 5 % 50 mL IVPB (0 g Intravenous Stopped 10/17/16 0147)  sodium chloride 0.9 % bolus 500 mL (0 mLs Intravenous Stopped 10/17/16 0100)  vancomycin (VANCOCIN) 1,500 mg in sodium chloride 0.9 % 500 mL IVPB (1,500 mg Intravenous New Bag/Given 10/17/16 0147)     Initial Impression / Assessment and Plan / ED Course  I have reviewed the triage vital signs and the nursing notes.  Pertinent labs & imaging results that were available during my care of the patient were reviewed by me and considered in my medical decision making (see chart for details).  Clinical Course as of Oct 17 1029  Tue Oct 17, 2016  1022 Eosinophils Absolute: 0.1 [CT]    Clinical Course User Index [CT] Courtney Paris, MD   Jackelyn Knife is a 61 y.o. male With a past medical history significant for hypertension, ESRD on dialysis, DM and prostate biopsy five days ago who presents from his dialysis center for tachycardia, malaise, fevers, chills, myalgias, dysuria, and hematuria.   History and exam Are seen above. On exam, patient's lungs are clear. Abdomen nontender, flanks nontender, CVA nontender. Physical exam otherwise unremarkable.  Laboratory testing showed concern for urinary tract infection with bacteria and leukocytes. The material also present. No squamous epithelial cells were seen. Lactic acid was barely elevated at 1.98. Other laboratory testing was similar to prior.  Initial EKG was concerning for sharp T waves however, potassium was nonelevated on labs.  Patient given Tylenol for fever of 102. Patient given some fluids.  Patient given antibiotics for urinary tract infection causing his tachycardia. Suspect dialysis and fluid removal  complicated his UTI leading to his tachycardia. Patient will be admitted for further  management. Given lack of lower abdominal pain and groin pain, do not feel patient has an acute prostatitis at this time however, given his recent biopsy, this will need to be considered and followed.   Patient admitted in stable condition for further management.    Final Clinical Impressions(s) / ED Diagnoses   Final diagnoses:  Acute cystitis with hematuria  Tachycardia  Fever, unspecified fever cause     Clinical Impression: 1. Acute cystitis with hematuria   2. Tachycardia   3. Fever, unspecified fever cause     Disposition: Admit to [place], MD 10/17/16 1032

## 2016-10-16 NOTE — ED Notes (Signed)
Pt reports blood in urine x 3 days.

## 2016-10-17 DIAGNOSIS — Z992 Dependence on renal dialysis: Secondary | ICD-10-CM | POA: Diagnosis not present

## 2016-10-17 DIAGNOSIS — H409 Unspecified glaucoma: Secondary | ICD-10-CM | POA: Diagnosis present

## 2016-10-17 DIAGNOSIS — E1100 Type 2 diabetes mellitus with hyperosmolarity without nonketotic hyperglycemic-hyperosmolar coma (NKHHC): Secondary | ICD-10-CM | POA: Diagnosis not present

## 2016-10-17 DIAGNOSIS — E1121 Type 2 diabetes mellitus with diabetic nephropathy: Secondary | ICD-10-CM | POA: Diagnosis present

## 2016-10-17 DIAGNOSIS — N390 Urinary tract infection, site not specified: Secondary | ICD-10-CM | POA: Diagnosis not present

## 2016-10-17 DIAGNOSIS — N2581 Secondary hyperparathyroidism of renal origin: Secondary | ICD-10-CM | POA: Diagnosis present

## 2016-10-17 DIAGNOSIS — E1122 Type 2 diabetes mellitus with diabetic chronic kidney disease: Secondary | ICD-10-CM | POA: Diagnosis present

## 2016-10-17 DIAGNOSIS — E785 Hyperlipidemia, unspecified: Secondary | ICD-10-CM | POA: Diagnosis present

## 2016-10-17 DIAGNOSIS — Z79899 Other long term (current) drug therapy: Secondary | ICD-10-CM | POA: Diagnosis not present

## 2016-10-17 DIAGNOSIS — B9620 Unspecified Escherichia coli [E. coli] as the cause of diseases classified elsewhere: Secondary | ICD-10-CM | POA: Diagnosis present

## 2016-10-17 DIAGNOSIS — I10 Essential (primary) hypertension: Secondary | ICD-10-CM | POA: Diagnosis not present

## 2016-10-17 DIAGNOSIS — Z794 Long term (current) use of insulin: Secondary | ICD-10-CM | POA: Diagnosis not present

## 2016-10-17 DIAGNOSIS — D631 Anemia in chronic kidney disease: Secondary | ICD-10-CM | POA: Diagnosis present

## 2016-10-17 DIAGNOSIS — N3001 Acute cystitis with hematuria: Secondary | ICD-10-CM | POA: Diagnosis present

## 2016-10-17 DIAGNOSIS — E1165 Type 2 diabetes mellitus with hyperglycemia: Secondary | ICD-10-CM | POA: Diagnosis present

## 2016-10-17 DIAGNOSIS — E7800 Pure hypercholesterolemia, unspecified: Secondary | ICD-10-CM | POA: Diagnosis present

## 2016-10-17 DIAGNOSIS — E8889 Other specified metabolic disorders: Secondary | ICD-10-CM | POA: Diagnosis present

## 2016-10-17 DIAGNOSIS — N186 End stage renal disease: Secondary | ICD-10-CM

## 2016-10-17 DIAGNOSIS — Z833 Family history of diabetes mellitus: Secondary | ICD-10-CM | POA: Diagnosis not present

## 2016-10-17 DIAGNOSIS — A419 Sepsis, unspecified organism: Principal | ICD-10-CM | POA: Diagnosis present

## 2016-10-17 DIAGNOSIS — I120 Hypertensive chronic kidney disease with stage 5 chronic kidney disease or end stage renal disease: Secondary | ICD-10-CM | POA: Diagnosis present

## 2016-10-17 LAB — HIV ANTIBODY (ROUTINE TESTING W REFLEX): HIV Screen 4th Generation wRfx: NONREACTIVE

## 2016-10-17 LAB — CBC
HCT: 33.9 % — ABNORMAL LOW (ref 39.0–52.0)
Hemoglobin: 11 g/dL — ABNORMAL LOW (ref 13.0–17.0)
MCH: 30.2 pg (ref 26.0–34.0)
MCHC: 32.4 g/dL (ref 30.0–36.0)
MCV: 93.1 fL (ref 78.0–100.0)
PLATELETS: 135 10*3/uL — AB (ref 150–400)
RBC: 3.64 MIL/uL — AB (ref 4.22–5.81)
RDW: 15 % (ref 11.5–15.5)
WBC: 8 10*3/uL (ref 4.0–10.5)

## 2016-10-17 LAB — BASIC METABOLIC PANEL
ANION GAP: 13 (ref 5–15)
BUN: 30 mg/dL — ABNORMAL HIGH (ref 6–20)
CO2: 27 mmol/L (ref 22–32)
CREATININE: 9.45 mg/dL — AB (ref 0.61–1.24)
Calcium: 7.5 mg/dL — ABNORMAL LOW (ref 8.9–10.3)
Chloride: 93 mmol/L — ABNORMAL LOW (ref 101–111)
GFR, EST AFRICAN AMERICAN: 6 mL/min — AB (ref >60)
GFR, EST NON AFRICAN AMERICAN: 5 mL/min — AB (ref >60)
Glucose, Bld: 242 mg/dL — ABNORMAL HIGH (ref 65–99)
Potassium: 5.5 mmol/L — ABNORMAL HIGH (ref 3.5–5.1)
SODIUM: 133 mmol/L — AB (ref 135–145)

## 2016-10-17 LAB — GLUCOSE, CAPILLARY
GLUCOSE-CAPILLARY: 127 mg/dL — AB (ref 65–99)
GLUCOSE-CAPILLARY: 210 mg/dL — AB (ref 65–99)
Glucose-Capillary: 86 mg/dL (ref 65–99)

## 2016-10-17 LAB — PROCALCITONIN: PROCALCITONIN: 48.33 ng/mL

## 2016-10-17 LAB — I-STAT CG4 LACTIC ACID, ED: Lactic Acid, Venous: 0.83 mmol/L (ref 0.5–1.9)

## 2016-10-17 MED ORDER — INSULIN ASPART 100 UNIT/ML ~~LOC~~ SOLN
0.0000 [IU] | Freq: Three times a day (TID) | SUBCUTANEOUS | Status: DC
  Administered 2016-10-17: 2 [IU] via SUBCUTANEOUS
  Administered 2016-10-17 – 2016-10-18 (×3): 1 [IU] via SUBCUTANEOUS
  Administered 2016-10-19: 3 [IU] via SUBCUTANEOUS

## 2016-10-17 MED ORDER — SODIUM CHLORIDE 0.9 % IV BOLUS (SEPSIS): 2000.0000 mL | Freq: Once | INTRAVENOUS | Status: DC

## 2016-10-17 MED ORDER — SEVELAMER CARBONATE 800 MG PO TABS
800.0000 mg | ORAL_TABLET | Freq: Three times a day (TID) | ORAL | Status: DC
  Administered 2016-10-17: 800 mg via ORAL
  Filled 2016-10-17 (×2): qty 1

## 2016-10-17 MED ORDER — SEVELAMER CARBONATE 800 MG PO TABS
1600.0000 mg | ORAL_TABLET | Freq: Three times a day (TID) | ORAL | Status: DC
  Administered 2016-10-17 – 2016-10-19 (×6): 1600 mg via ORAL
  Filled 2016-10-17 (×6): qty 2

## 2016-10-17 MED ORDER — VANCOMYCIN HCL IN DEXTROSE 750-5 MG/150ML-% IV SOLN
750.0000 mg | INTRAVENOUS | Status: DC
  Administered 2016-10-18: 750 mg via INTRAVENOUS
  Filled 2016-10-17: qty 150

## 2016-10-17 MED ORDER — RENA-VITE PO TABS
1.0000 | ORAL_TABLET | Freq: Every day | ORAL | Status: DC
  Administered 2016-10-17 – 2016-10-18 (×2): 1 via ORAL
  Filled 2016-10-17 (×2): qty 1

## 2016-10-17 MED ORDER — DEXTROSE 5 % IV SOLN
2.0000 g | Freq: Once | INTRAVENOUS | Status: AC
  Administered 2016-10-17: 2 g via INTRAVENOUS
  Filled 2016-10-17: qty 2

## 2016-10-17 MED ORDER — ACETAMINOPHEN 500 MG PO TABS
1000.0000 mg | ORAL_TABLET | Freq: Four times a day (QID) | ORAL | Status: DC | PRN
  Administered 2016-10-17 (×2): 1000 mg via ORAL
  Filled 2016-10-17: qty 2

## 2016-10-17 MED ORDER — VANCOMYCIN HCL 10 G IV SOLR
1500.0000 mg | Freq: Once | INTRAVENOUS | Status: AC
  Administered 2016-10-17: 1500 mg via INTRAVENOUS
  Filled 2016-10-17: qty 1500

## 2016-10-17 MED ORDER — SODIUM CHLORIDE 0.9 % IV SOLN
INTRAVENOUS | Status: DC
Start: 2016-10-17 — End: 2016-10-17

## 2016-10-17 MED ORDER — PREDNISOLONE ACETATE 1 % OP SUSP
1.0000 [drp] | Freq: Every day | OPHTHALMIC | Status: DC
  Administered 2016-10-17 – 2016-10-18 (×2): 1 [drp] via OPHTHALMIC
  Filled 2016-10-17: qty 1

## 2016-10-17 MED ORDER — SODIUM CHLORIDE 0.9% FLUSH
3.0000 mL | Freq: Two times a day (BID) | INTRAVENOUS | Status: DC
  Administered 2016-10-17 – 2016-10-19 (×3): 3 mL via INTRAVENOUS

## 2016-10-17 MED ORDER — ONDANSETRON HCL 4 MG PO TABS: 4.0000 mg | ORAL_TABLET | Freq: Four times a day (QID) | ORAL | Status: DC | PRN

## 2016-10-17 MED ORDER — DEXTROSE 5 % IV SOLN
2.0000 g | INTRAVENOUS | Status: DC
  Administered 2016-10-18: 2 g via INTRAVENOUS
  Filled 2016-10-17: qty 2

## 2016-10-17 MED ORDER — HYDRALAZINE HCL 20 MG/ML IJ SOLN: 5.0000 mg | INTRAMUSCULAR | Status: DC | PRN

## 2016-10-17 MED ORDER — ONDANSETRON HCL 4 MG/2ML IJ SOLN: 4.0000 mg | Freq: Four times a day (QID) | INTRAMUSCULAR | Status: DC | PRN

## 2016-10-17 MED ORDER — CINACALCET HCL 30 MG PO TABS
120.0000 mg | ORAL_TABLET | ORAL | Status: DC
  Administered 2016-10-18: 120 mg via ORAL
  Filled 2016-10-17: qty 4

## 2016-10-17 MED ORDER — CALCITRIOL 0.5 MCG PO CAPS
1.0000 ug | ORAL_CAPSULE | ORAL | Status: DC
  Administered 2016-10-18: 1 ug via ORAL

## 2016-10-17 MED ORDER — INSULIN GLARGINE 100 UNIT/ML ~~LOC~~ SOLN
20.0000 [IU] | Freq: Every day | SUBCUTANEOUS | Status: DC
  Administered 2016-10-17 – 2016-10-18 (×2): 20 [IU] via SUBCUTANEOUS
  Filled 2016-10-17 (×3): qty 0.2

## 2016-10-17 MED ORDER — DEXTROSE 5 % IV SOLN: 1.0000 g | INTRAVENOUS | Status: DC

## 2016-10-17 MED ORDER — INSULIN GLARGINE 100 UNIT/ML ~~LOC~~ SOLN
20.0000 [IU] | Freq: Every day | SUBCUTANEOUS | Status: DC
  Filled 2016-10-17: qty 0.2

## 2016-10-17 NOTE — Progress Notes (Signed)
Pharmacy Antibiotic Note  Charles [name] is a 61 y.o. male admitted on 10/16/2016 with UTI s/p recent prostate biopsy.  Pharmacy has been consulted for Vancomycin and ceftazidime dosing.  Plan: Vancomycin 750mg  IV qHD-MWF Ceftazidime 2g IV x1, then 2g IV qMWF @ 1800 Follow c/s, clinical progression, HD schedule/tolerance, and length of therapy Pre-HD vancomycin levels PRN  Height: 5\' 10"  (177.8 cm) Weight: 163 lb 1.6 oz (74 kg) IBW/kg (Calculated) : 73  Temp (24hrs), Avg:100.1 F (37.8 C), Min:98.7 F (37.1 C), Max:102.1 F (38.9 C)   Recent Labs Lab 10/16/16 2215 10/16/16 2222 10/17/16 0042 10/17/16 0345  WBC 8.6  --   --  8.0  CREATININE 8.18*  --   --  9.45*  LATICACIDVEN  --  1.98* 0.83  --     Estimated Creatinine Clearance: 8.8 mL/min (A) (by C-G formula based on SCr of 9.45 mg/dL (H)).    No Known Allergies   Vanc 3/19>> Ceftaz 3/20>>  3/19 Urine: 3/19 Bcx:  Ander Purpura D. Lillianne Eick, PharmD, BCPS Clinical Pharmacist Pager: [phone] 10/17/2016 10:00 AM

## 2016-10-17 NOTE — Progress Notes (Signed)
Tech offered Pt a bath. Pt stated that he could do his bath without the tech. Tech gave Pt all bath supplies,  Wife did assist Pt with bath.

## 2016-10-17 NOTE — H&P (Signed)
History and Physical    Charles [name]:[id number] DOB: 1955-08-16 DOA: 10/16/2016  Referring MD/NP/PA:   PCP: [facility]   Patient coming from:  The patient is coming from home.  At baseline, pt is independent for most of ADL.  Chief Complaint: Hematuria, dysuria, fever  HPI: Charles [name] is a 61 y.o. male with medical history significant of hypertension, hyperlipidemia, diabetes mellitus, ESRD-HD (MWF), glaucoma, s/p of recent prostate biopsy 5 days ago, who presents with hematuria, dysuria, fever.  Patient states that he underwent prostate biopsy 5 days ago. He developed hematuria, which has been persistent. He also developed dysuria, burning on urination, but no urinary frequency. He also has fever and chills. Denies flank pain. Patient does not have nausea, vomiting, diarrhea, abdominal pain. No chest pain, shortness breath, cough or unilateral weakness. Pt states that he took antibiotics (not remember the name of Abx) 2 days before and 2 days after procedure.  ED Course: pt was found to have positive urinalysis with large amount of leukocyte, WBC 8.6, lactate of 1.98, INR 1.05, lipase 29, potassium 4.2, bicarbonate 29, creatinine 8.18, temperature 102.1, tachycardia, tachypnea, O2 sats 95% on room air. Patient is admitted to telemetry bed as inpatient.  Review of Systems:   General: has fevers, chills, no changes in body weight, has fatigue HEENT: no blurry vision, hearing changes or sore throat Respiratory: no dyspnea, coughing, wheezing CV: no chest pain, no palpitations GI: no nausea, vomiting, abdominal pain, diarrhea, constipation GU: has dysuria, burning on urination, no  increased urinary frequency, has hematuria  Ext: no leg edema Neuro: no unilateral weakness, numbness, or tingling, no vision change or hearing loss Skin: no rash, no skin tear. MSK: No muscle spasm, no deformity, no limitation of range of movement in spin Heme: No easy bruising.  Travel  history: No recent long distant travel.  Allergy: No Known Allergies  Past Medical History:  Diagnosis Date  . Anemia   . Diabetes mellitus without complication (HCC)    Type 2   . End stage kidney disease ([place])    HD   M-W-F  . Glaucoma   . High cholesterol   . Hypertension     Past Surgical History:  Procedure Laterality Date  . APPENDECTOMY    . AV FISTULA PLACEMENT Right 05/19/2013   Procedure: RIGHT BRACHIOCEPHALIC  ARTERIOVENOUS (AV) FISTULA CREATION;  Surgeon: Conrad McSherrystown, MD;  Location: [place];  Service: Vascular;  Laterality: Right;  . EYE SURGERY    . MULTIPLE EXTRACTIONS WITH ALVEOLOPLASTY N/A 01/28/2015   Procedure: Extraction of tooth #'s 1,5,40,08,67,61 with alveoloplasty and gross debridement of remaining teeth.;  Surgeon: Lenn Cal, DDS;  Location: [place];  Service: Oral Surgery;  Laterality: N/A;  . PROSTATE BIOPSY      Social History:  reports that he has never smoked. He has never used smokeless tobacco. He reports that he does not drink alcohol or use drugs.  Family History:  Family History  Problem Relation Age of Onset  . Diabetes Mother   . Colon cancer Neg Hx      Prior to Admission medications   Medication Sig Start Date End Date Taking? Authorizing Provider  acetaminophen (TYLENOL) 500 MG tablet Take 1,000 mg by mouth every 6 (six) hours as needed for moderate pain.    Yes Historical Provider, MD  insulin glargine (LANTUS) 100 UNIT/ML injection Inject 25 Units into the skin at bedtime.   Yes Historical Provider, MD  multivitamin (RENA-VIT)  TABS tablet Take 1 tablet by mouth daily.   Yes Historical Provider, MD  prednisoLONE acetate (PRED FORTE) 1 % ophthalmic suspension Place 1 drop into both eyes at bedtime.  01/16/15  Yes Historical Provider, MD  sevelamer carbonate (RENVELA) 800 MG tablet Take 800 mg by mouth 3 (three) times daily with meals.    Yes Historical Provider, MD    Physical Exam: Vitals:   10/17/16 0021 10/17/16 0030 10/17/16  0213 10/17/16 0349  BP:  132/64 (!) 125/57 135/69  Pulse:  90 91 82  Resp:  20 20 20   Temp: 99.5 F (37.5 C)  100.1 F (37.8 C) 98.7 F (37.1 C)  TempSrc: Oral  Oral Oral  SpO2:  98% 99% 98%  Weight:   74 kg (163 lb 1.6 oz)   Height:       General: Not in acute distress HEENT:       Eyes: PERRL, EOMI, no scleral icterus.       ENT: No discharge from the ears and nose, no pharynx injection, no tonsillar enlargement.        Neck: No JVD, no bruit, no mass felt. Heme: No neck lymph node enlargement. Cardiac: S1/S2, RRR, No murmurs, No gallops or rubs. Respiratory: No rales, wheezing, rhonchi or rubs. GI: Soft, nondistended, nontender, no rebound pain, no organomegaly, BS present. GU: No hematuria Ext: No pitting leg edema bilaterally. 2+DP/PT pulse bilaterally. Musculoskeletal: No joint deformities, No joint redness or warmth, no limitation of ROM in spin. Skin: No rashes.  Neuro: Alert, oriented X3, cranial nerves II-XII grossly intact, moves all extremities normally.  Psych: Patient is not psychotic, no suicidal or hemocidal ideation.  Labs on Admission: I have personally reviewed following labs and imaging studies  CBC:  Recent Labs Lab 10/16/16 2215 10/17/16 0345  WBC 8.6 8.0  NEUTROABS 7.2  --   HGB 12.6* 11.0*  HCT 37.6* 33.9*  MCV 91.9 93.1  PLT 164 967*   Basic Metabolic Panel:  Recent Labs Lab 10/16/16 2215 10/17/16 0345  NA 136 133*  K 4.2 5.5*  CL 90* 93*  CO2 29 27  GLUCOSE 189* 242*  BUN 23* 30*  CREATININE 8.18* 9.45*  CALCIUM 8.7* 7.5*   GFR: Estimated Creatinine Clearance: 8.8 mL/min (A) (by C-G formula based on SCr of 9.45 mg/dL (H)). Liver Function Tests:  Recent Labs Lab 10/16/16 2215  AST 32  ALT 37  ALKPHOS 168*  BILITOT 0.7  PROT 7.8  ALBUMIN 3.2*    Recent Labs Lab 10/16/16 2215  LIPASE 29   No results for input(s): AMMONIA in the last 168 hours. Coagulation Profile:  Recent Labs Lab 10/16/16 2215  INR 1.05    Cardiac Enzymes: No results for input(s): CKTOTAL, CKMB, CKMBINDEX, TROPONINI in the last 168 hours. BNP (last 3 results) No results for input(s): PROBNP in the last 8760 hours. HbA1C: No results for input(s): HGBA1C in the last 72 hours. CBG:  Recent Labs Lab 10/16/16 2119  GLUCAP 165*   Lipid Profile: No results for input(s): CHOL, HDL, LDLCALC, TRIG, CHOLHDL, LDLDIRECT in the last 72 hours. Thyroid Function Tests: No results for input(s): TSH, T4TOTAL, FREET4, T3FREE, THYROIDAB in the last 72 hours. Anemia Panel: No results for input(s): VITAMINB12, FOLATE, FERRITIN, TIBC, IRON, RETICCTPCT in the last 72 hours. Urine analysis:    Component Value Date/Time   COLORURINE RED (A) 10/16/2016 2204   APPEARANCEUR CLOUDY (A) 10/16/2016 2204   LABSPEC 1.011 10/16/2016 2204   PHURINE 8.0 10/16/2016 2204  GLUCOSEU NEGATIVE 10/16/2016 [place] (A) 10/16/2016 2204   HGBUR large 07/29/2009 [place] 10/16/2016 [place] 10/16/2016 2204   PROTEINUR 100 (A) 10/16/2016 2204   UROBILINOGEN 0.2 06/16/2010 2133   NITRITE NEGATIVE 10/16/2016 2204   LEUKOCYTESUR LARGE (A) 10/16/2016 2204   Sepsis Labs: @LABRCNTIP (procalcitonin:4,lacticidven:4) )No results found for this or any previous visit (from the past 240 hour(s)).   Radiological Exams on Admission: Dg Chest 2 View  Result Date: 10/16/2016 CLINICAL DATA:  62 year old male with tachycardia. EXAM: CHEST  2 VIEW COMPARISON:  Chest radiograph dated 05/16/2013 FINDINGS: The heart size and mediastinal contours are within normal limits. Both lungs are clear. The visualized skeletal structures are unremarkable. IMPRESSION: No active cardiopulmonary disease. Electronically Signed   By: Anner Crete M.D.   On: 10/16/2016 22:50     EKG: Independently reviewed.  Sinus rhythm, QTC 449, bilateral atrial enlargement  Assessment/Plan Principal Problem:   Complicated UTI (urinary tract  infection) Active Problems:   Diabetes mellitus, type 2 (HCC)   Essential hypertension   End stage renal disease ([place])   Sepsis ([place])   Sepsis due to complicated UTI: Patient has hematuria, dysuria and burning on urination, plus positive urinalysis, consistent with UTI. Patient is s/p of prostate biopsy, likely having complicated UTI. Patient meets criteria for sepsis with fever, tachycardia and tachypnea. Lactate is normal. Currently hemodynamically stable. No leukocytosis.   - Admit to telemetry bed as in;pt -  Ceftriaxone and vanco by IV - Follow up results of urine and blood cx and amend antibiotic regimen if needed per sensitivity results - will get Procalcitonin and trend lactic acid levels per sepsis protocol. - IVF: 500.L of NS bolus in ED (patient has ESRD-HD, limiting aggressive IV fluids treatment)  DM-II: Last A1c 12.3 on 08/30/15, poorly controled. Patient is taking at home -will decrease Lantus dose from 25 to 20 units daily -SSI  HTN: not on Bp meds -IV hydralazine when necessary  End stage renal disease-HD (MWF): potassium 4.2, bicarbonate 29, creatinine 8.18. Pt could not complete full course of HD on Monday due to acute sickness. -continue renvela -left messages to renal box for dialysis.    DVT ppx: SCD Code Status: Full code Family Communication: Yes, patient's wife at bed side Disposition Plan:  Anticipate discharge back to previous home environment Consults called:  none Admission status: Inpatient/tele      Date of Service 10/17/2016    Ivor Costa [facility] Pager [phone]  If 7PM-7AM, please contact night-coverage [email or web address] Password [facility] 10/17/2016, 6:09 AM  ancillary the review

## 2016-10-17 NOTE — Progress Notes (Signed)
Pharmacy Antibiotic Note  Charles [name] is a 61 y.o. male admitted on 10/16/2016 with UTI s/p recent prostate biopsy.  Pharmacy has been consulted for Vancomycin  dosing.  Plan: Vancomycin 1500 mg IV now, then 750 mg IV after each HD  Height: 5\' 10"  (177.8 cm) Weight: 171 lb 15.3 oz (78 kg) IBW/kg (Calculated) : 73  Temp (24hrs), Avg:102.1 F (38.9 C), Min:102.1 F (38.9 C), Max:102.1 F (38.9 C)   Recent Labs Lab 10/16/16 2215 10/16/16 2222  WBC 8.6  --   CREATININE 8.18*  --   LATICACIDVEN  --  1.98*    Estimated Creatinine Clearance: 10.2 mL/min (A) (by C-G formula based on SCr of 8.18 mg/dL (H)).    No Known Allergies  Caryl Pina 10/17/2016 12:42 AM

## 2016-10-17 NOTE — Consult Note (Signed)
[facility] Renal Consultation Note    Indication for Consultation:  Management of ESRD/hemodialysis; anemia, hypertension/volume and secondary hyperparathyroidism  HPI: Charles [name] is a 61 y.o. 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 Charles [name] presented to ED from dialysis yesterday with fever and elevated HR. He reports hematuria and dysuria following prostate biopsy last Wednesday 3/14. ED Course significant for temp 102.53F Pulse 108  UA with large leukocytes WBC 8.6 Hgb 12.6. Urine and blood cultures collected and admitted for further evaluation and treatment.   Seen at bedside currently with family present. Reports feeling much better today, laughing with family. Ate well this morning. Still some dysuria and hematuria but improving. Denies abdominal pain, CP SOB, nausea/vomiting.  Last HD yesterday, completed full treatment and reached EDW.  Does occasionally miss treatments.   Past Medical History:  Diagnosis Date  . Anemia   . Diabetes mellitus without complication (HCC)    Type 2   . End stage kidney disease ([place])    HD   M-W-F  . Glaucoma   . High cholesterol   . Hypertension    Past Surgical History:  Procedure Laterality Date  . APPENDECTOMY    . AV FISTULA PLACEMENT Right 05/19/2013   Procedure: RIGHT BRACHIOCEPHALIC  ARTERIOVENOUS (AV) FISTULA CREATION;  Surgeon: Conrad Rodney, MD;  Location: [place];  Service: Vascular;  Laterality: Right;  . EYE SURGERY    . MULTIPLE EXTRACTIONS WITH ALVEOLOPLASTY N/A 01/28/2015   Procedure: Extraction of tooth #'s 9,5,28,41,32,44 with alveoloplasty and gross debridement of remaining teeth.;  Surgeon: Lenn Cal, DDS;  Location: [place];  Service: Oral Surgery;  Laterality: N/A;  . PROSTATE BIOPSY     Family History  Problem Relation Age of Onset  . Diabetes Mother   . Colon cancer Neg Hx    Social History:  reports that he has never smoked. He has never used smokeless tobacco. He  reports that he does not drink alcohol or use drugs. No Known Allergies Prior to Admission medications   Medication Sig Start Date End Date Taking? Authorizing Provider  acetaminophen (TYLENOL) 500 MG tablet Take 1,000 mg by mouth every 6 (six) hours as needed for moderate pain.    Yes Historical Provider, MD  insulin glargine (LANTUS) 100 UNIT/ML injection Inject 25 Units into the skin at bedtime.   Yes Historical Provider, MD  multivitamin (RENA-VIT) TABS tablet Take 1 tablet by mouth daily.   Yes Historical Provider, MD  prednisoLONE acetate (PRED FORTE) 1 % ophthalmic suspension Place 1 drop into both eyes at bedtime.  01/16/15  Yes Historical Provider, MD  sevelamer carbonate (RENVELA) 800 MG tablet Take 800 mg by mouth 3 (three) times daily with meals.    Yes Historical Provider, MD   Current Facility-Administered Medications  Medication Dose Route Frequency Provider Last Rate Last Dose  . acetaminophen (TYLENOL) tablet 1,000 mg  1,000 mg Oral Q6H PRN Ivor Costa, MD   1,000 mg at 10/17/16 0102  . cefTAZidime (FORTAZ) 2 g in dextrose 5 % 50 mL IVPB  2 g Intravenous Once Lauren D Bajbus, RPH      . [START ON 10/18/2016] cefTAZidime (FORTAZ) 2 g in dextrose 5 % 50 mL IVPB  2 g Intravenous Q M,W,F-2000 Lauren D Bajbus, RPH      . hydrALAZINE (APRESOLINE) injection 5 mg  5 mg Intravenous Q2H PRN Ivor Costa, MD      . insulin aspart (novoLOG) injection 0-9 Units  0-9 Units Subcutaneous TID WC Ivor Costa, MD   2 Units at 10/17/16 [phone]  . insulin glargine (LANTUS) injection 20 Units  20 Units Subcutaneous QHS Ivor Costa, MD      . multivitamin (RENA-VIT) tablet 1 tablet  1 tablet Oral QHS Ivor Costa, MD      . ondansetron [facility]) tablet 4 mg  4 mg Oral Q6H PRN Ivor Costa, MD       Or  . ondansetron [facility]) injection 4 mg  4 mg Intravenous Q6H PRN Ivor Costa, MD      . prednisoLONE acetate (PRED FORTE) 1 % ophthalmic suspension 1 drop  1 drop Both Eyes QHS Ivor Costa, MD      . sevelamer carbonate  (RENVELA) tablet 800 mg  800 mg Oral TID WC Ivor Costa, MD   800 mg at 10/17/16 0834  . sodium chloride flush (NS) 0.9 % injection 3 mL  3 mL Intravenous Q12H Ivor Costa, MD      . Derrill Memo ON 10/18/2016] vancomycin (VANCOCIN) IVPB 750 mg/150 ml premix  750 mg Intravenous Q M,W,F-HD Ivor Costa, MD        ROS: As per HPI otherwise negative.  Physical Exam: Vitals:   10/17/16 0021 10/17/16 0030 10/17/16 0213 10/17/16 0349  BP:  132/64 (!) 125/57 135/69  Pulse:  90 91 82  Resp:  20 20 20   Temp: 99.5 F (37.5 C)  100.1 F (37.8 C) 98.7 F (37.1 C)  TempSrc: Oral  Oral Oral  SpO2:  98% 99% 98%  Weight:   74 kg (163 lb 1.6 oz)   Height:         General: WDWN AM NAD  Head: NCAT sclera not icteric MMM Neck: Supple. No JVD No masses Lungs: CTA bilaterally without wheezes, rales, or rhonchi. Breathing is unlabored. Heart: RRR with S1 S2 Abdomen: soft NT + BS Lower extremities:without edema or ischemic changes, no open wounds  Neuro: A & O  X 3. Moves all extremities spontaneously. Psych:  Responds to questions appropriately with a normal affect. Dialysis Access: LUE AVF+thrill   Labs: Basic Metabolic Panel:  Recent Labs Lab 10/16/16 2215 10/17/16 0345  NA 136 133*  K 4.2 5.5*  CL 90* 93*  CO2 29 27  GLUCOSE 189* 242*  BUN 23* 30*  CREATININE 8.18* 9.45*  CALCIUM 8.7* 7.5*   Liver Function Tests:  Recent Labs Lab 10/16/16 2215  AST 32  ALT 37  ALKPHOS 168*  BILITOT 0.7  PROT 7.8  ALBUMIN 3.2*    Recent Labs Lab 10/16/16 2215  LIPASE 29   No results for input(s): AMMONIA in the last 168 hours. CBC:  Recent Labs Lab 10/16/16 2215 10/17/16 0345  WBC 8.6 8.0  NEUTROABS 7.2  --   HGB 12.6* 11.0*  HCT 37.6* 33.9*  MCV 91.9 93.1  PLT 164 135*   Cardiac Enzymes: No results for input(s): CKTOTAL, CKMB, CKMBINDEX, TROPONINI in the last 168 hours. CBG:  Recent Labs Lab 10/16/16 2119  GLUCAP 165*   Iron Studies: No results for input(s): IRON, TIBC,  TRANSFERRIN, FERRITIN in the last 72 hours. Studies/Results: Dg Chest 2 View  Result Date: 10/16/2016 CLINICAL DATA:  61 year old male with tachycardia. EXAM: CHEST  2 VIEW COMPARISON:  Chest radiograph dated 05/16/2013 FINDINGS: The heart size and mediastinal contours are within normal limits. Both lungs are clear. The visualized skeletal structures are unremarkable. IMPRESSION: No active cardiopulmonary disease. Electronically Signed   By: Laren Everts.D.  On: 10/16/2016 22:50    Dialysis Orders:  [facility] MWF 4h35min 180F BFR 400/800 2K/2Ca EDW 77.5 kg  -LUE AVF Heparin 7800 UIV -Venofer 50mg  IV q week -Calcitriol 1 mcg PO q HD  -Sensipar 120mg  PO q HD  Assessment/Plan: 1.  Hematuria/Fever/UTI - UA +leukocytes urine and blood cultures pending on IV  Vanc/Fortaz -per primary  2.  ESRD -  MWF - cont on schedule  3.  Hypertension/volume  - BP controlled no meds on home list/No gross volume on exam - Plan HD tomorrow UF to EDW  4.  Anemia  - Hgb 11.0 follow no ESA yet  5.  Metabolic bone disease -  Cont VDRA/Renvela binder/Sensipar  6.  Nutrition - Renal diet/vitamins  7. DM - per primary   Lynnda Child PA-C [place] Pager [phone] 10/17/2016, 10:20 AM   Pt seen, examined and agree w A/P as above.  Kelly Splinter MD [employer] pager [phone]   10/17/2016, 5:13 PM

## 2016-10-17 NOTE — Progress Notes (Signed)
Patient seen and examined   61 y.o. male with medical history significant of hypertension, hyperlipidemia, diabetes mellitus, ESRD-HD (MWF), glaucoma, s/p of recent prostate biopsy 5 days ago, who presents with hematuria, dysuria, fever.Pro calcitonin markedly elevated. Follow urine culture blood culture results. Continue with broad-spectrum antibiotics. Hemodynamically stable.

## 2016-10-17 NOTE — Care Management Note (Signed)
Case Management Note  Patient Details  Name: CHIBUIKE FLEEK MRN: [id number] Date of Birth: Oct 14, 1955  Subjective/Objective:       CM following for progession and d/c planning.              Action/Plan: 10/17/2016 No d/c needs identified at this time will follow for orders or recommendations.   Expected Discharge Date:    10/19/2016              Expected Discharge Plan:  Home/Self Care  In-House Referral:  NA  Discharge planning Services  NA  Post Acute Care Choice:    Choice offered to:  NA  DME Arranged:   NA DME Agency:   NA  HH Arranged:   NA HH Agency:   NA  Status of Service:  In process, will continue to follow  If discussed at Long Length of Stay Meetings, dates discussed:    Additional Comments:  Adron Bene, RN 10/17/2016, 1:26 PM

## 2016-10-18 DIAGNOSIS — N3001 Acute cystitis with hematuria: Secondary | ICD-10-CM

## 2016-10-18 LAB — COMPREHENSIVE METABOLIC PANEL
ALK PHOS: 129 U/L — AB (ref 38–126)
ALT: 27 U/L (ref 17–63)
ANION GAP: 13 (ref 5–15)
AST: 20 U/L (ref 15–41)
Albumin: 2.4 g/dL — ABNORMAL LOW (ref 3.5–5.0)
BILIRUBIN TOTAL: 0.6 mg/dL (ref 0.3–1.2)
BUN: 44 mg/dL — AB (ref 6–20)
CO2: 27 mmol/L (ref 22–32)
Calcium: 7.9 mg/dL — ABNORMAL LOW (ref 8.9–10.3)
Chloride: 93 mmol/L — ABNORMAL LOW (ref 101–111)
Creatinine, Ser: 10.19 mg/dL — ABNORMAL HIGH (ref 0.61–1.24)
GFR calc Af Amer: 6 mL/min — ABNORMAL LOW (ref >60)
GFR calc non Af Amer: 5 mL/min — ABNORMAL LOW (ref >60)
GLUCOSE: 129 mg/dL — AB (ref 65–99)
POTASSIUM: 4.6 mmol/L (ref 3.5–5.1)
SODIUM: 133 mmol/L — AB (ref 135–145)
Total Protein: 6.1 g/dL — ABNORMAL LOW (ref 6.5–8.1)

## 2016-10-18 LAB — CBC
HEMATOCRIT: 30.7 % — AB (ref 39.0–52.0)
HEMOGLOBIN: 10.1 g/dL — AB (ref 13.0–17.0)
MCH: 30.1 pg (ref 26.0–34.0)
MCHC: 32.9 g/dL (ref 30.0–36.0)
MCV: 91.6 fL (ref 78.0–100.0)
Platelets: 140 10*3/uL — ABNORMAL LOW (ref 150–400)
RBC: 3.35 MIL/uL — ABNORMAL LOW (ref 4.22–5.81)
RDW: 14.4 % (ref 11.5–15.5)
WBC: 7.9 10*3/uL (ref 4.0–10.5)

## 2016-10-18 LAB — GLUCOSE, CAPILLARY
Glucose-Capillary: 136 mg/dL — ABNORMAL HIGH (ref 65–99)
Glucose-Capillary: 135 mg/dL — ABNORMAL HIGH (ref 65–99)

## 2016-10-18 MED ORDER — HEPARIN SODIUM (PORCINE) 1000 UNIT/ML DIALYSIS: 1000.0000 [IU] | INTRAMUSCULAR | Status: DC | PRN

## 2016-10-18 MED ORDER — PENTAFLUOROPROP-TETRAFLUOROETH EX AERO
1.0000 | INHALATION_SPRAY | CUTANEOUS | Status: DC | PRN
Start: 2016-10-18 — End: 2016-10-18

## 2016-10-18 MED ORDER — LIDOCAINE HCL (PF) 1 % IJ SOLN: 5.0000 mL | INTRAMUSCULAR | Status: DC | PRN

## 2016-10-18 MED ORDER — CALCITRIOL 0.5 MCG PO CAPS
ORAL_CAPSULE | ORAL | Status: AC
  Filled 2016-10-18: qty 2

## 2016-10-18 MED ORDER — VANCOMYCIN HCL IN DEXTROSE 750-5 MG/150ML-% IV SOLN
INTRAVENOUS | Status: AC
  Administered 2016-10-18: 750 mg via INTRAVENOUS
  Filled 2016-10-18: qty 150

## 2016-10-18 MED ORDER — HEPARIN SODIUM (PORCINE) 1000 UNIT/ML DIALYSIS
20.0000 [IU]/kg | INTRAMUSCULAR | Status: DC | PRN
Start: 2016-10-18 — End: 2016-10-18

## 2016-10-18 MED ORDER — SODIUM CHLORIDE 0.9 % IV SOLN: 100.0000 mL | INTRAVENOUS | Status: DC | PRN

## 2016-10-18 MED ORDER — SODIUM CHLORIDE 0.9 % IV SOLN
100.0000 mL | INTRAVENOUS | Status: DC | PRN
Start: 2016-10-18 — End: 2016-10-18

## 2016-10-18 MED ORDER — LIDOCAINE-PRILOCAINE 2.5-2.5 % EX CREA
1.0000 | TOPICAL_CREAM | CUTANEOUS | Status: DC | PRN
Start: 2016-10-18 — End: 2016-10-18

## 2016-10-18 MED ORDER — HEPARIN SODIUM (PORCINE) 5000 UNIT/ML IJ SOLN
5000.0000 [IU] | Freq: Three times a day (TID) | INTRAMUSCULAR | Status: DC
  Administered 2016-10-18 (×2): 5000 [IU] via SUBCUTANEOUS
  Filled 2016-10-18 (×2): qty 1

## 2016-10-18 NOTE — Progress Notes (Addendum)
[facility] PROGRESS NOTE  Charles [name] FHQ:[id number] DOB: 1955-09-09 DOA: 10/16/2016   PCP: [facility]     Assessment/Plan: Principal Problem:   Complicated UTI (urinary tract infection) Active Problems:   Diabetes mellitus, type 2 ([place])   Essential hypertension   End stage renal disease ([place])   Sepsis ([place])   Acute cystitis with hematuria   61 y.o.malewith medical history significant of hypertension, hyperlipidemia, diabetes mellitus, ESRD-HD (MWF), glaucoma, s/p of recent prostate biopsy 5 days ago, whopresents with hematuria, dysuria, fever.Pro calcitonin markedly elevated. Follow urine culture blood culture results. Continue with broad-spectrum antibiotics\  Assessment/Plan Sepsis due to complicated UTI: Patient has hematuria, dysuria and burning on urination, plus positive urinalysis, consistent with UTI. Complicated UTI secondary to s/p of prostate biopsy . Patient met  criteria for sepsis with fever, tachycardia and tachypnea. Lactate is normal. Currently hemodynamically stable. No leukocytosis. Continue telemetry Continue Fortaz and vancomycin Follow up results of urine and blood cx and narrow antibiotic regimen if needed per sensitivity results Pro calcitonin markedly elevated    DM-II: Last A1c 12.3 on 08/30/15,  Controlled in the hospital Decreased Lantus dose from 25 to 20 units daily -SSI  HTN: not on Bp meds -IV hydralazine when necessary  End stage renal disease-HD (MWF): potassium 4.2, bicarbonate 29, creatinine 8.18. Pt could not complete full course of HD on Monday due to acute sickness. -continue renvela Nephrology following ESRD -  MWF - cont on schedule    DVT prophylaxsis heparin subcutaneous  Code Status:  Full code   Family Communication: Discussed in detail with the patient, all imaging results, lab results explained to the patient   Disposition Plan:  Anticipate discharge tomorrow       Consultants:  Nephrology  Procedures:  None  Antibiotics: Anti-infectives    Start     Dose/Rate Route Frequency Ordered Stop   10/18/16 2000  cefTAZidime (FORTAZ) 2 g in dextrose 5 % 50 mL IVPB     2 g 100 mL/hr over 30 Minutes Intravenous Every M-W-F (2000) 10/17/16 1003     10/18/16 1200  vancomycin (VANCOCIN) IVPB 750 mg/150 ml premix     750 mg 150 mL/hr over 60 Minutes Intravenous Every M-W-F (Hemodialysis) 10/17/16 0045         HPI/Subjective: Afebrile overnight, overall improved  Objective: Vitals:   10/18/16 0830 10/18/16 0900 10/18/16 0930 10/18/16 1000  BP: (!) 151/57 (!) 155/72 (!) 171/82 (!) 156/76  Pulse: 87 87 84 91  Resp: 18 18    Temp:      TempSrc:      SpO2:      Weight:      Height:        Intake/Output Summary (Last 24 hours) at 10/18/16 1030 Last data filed at 10/18/16 1022  Gross per 24 hour  Intake              240 ml  Output                0 ml  Net              240 ml    Exam:  Examination:  General exam: Appears calm and comfortable  Respiratory system: Clear to auscultation. Respiratory effort normal. Cardiovascular system: S1 & S2 heard, RRR. No JVD, murmurs, rubs, gallops or clicks. No pedal edema. Gastrointestinal system: Abdomen is nondistended, soft and nontender. No organomegaly or masses felt. Normal bowel sounds heard. Central  nervous system: Alert and oriented. No focal neurological deficits. Extremities: Symmetric 5 x 5 power. Skin: No rashes, lesions or ulcers Psychiatry: Judgement and insight appear normal. Mood & affect appropriate.     Data Reviewed: I have personally reviewed following labs and imaging studies  Micro Results Recent Results (from the past 240 hour(s))  Blood culture (routine x 2)     Status: None (Preliminary result)   Collection Time: 10/16/16  9:30 PM  Result Value Ref Range Status   Specimen Description BLOOD LEFT FOREARM  Final   Special Requests BOTTLES DRAWN AEROBIC ONLY 5CC  Final    Culture NO GROWTH 2 DAYS  Final   Report Status PENDING  Incomplete  Blood culture (routine x 2)     Status: None (Preliminary result)   Collection Time: 10/16/16 10:15 PM  Result Value Ref Range Status   Specimen Description BLOOD LEFT ARM  Final   Special Requests IN PEDIATRIC BOTTLE 4CC  Final   Culture NO GROWTH 2 DAYS  Final   Report Status PENDING  Incomplete    Radiology Reports Dg Chest 2 View  Result Date: 10/16/2016 CLINICAL DATA:  61 year old male with tachycardia. EXAM: CHEST  2 VIEW COMPARISON:  Chest radiograph dated 05/16/2013 FINDINGS: The heart size and mediastinal contours are within normal limits. Both lungs are clear. The visualized skeletal structures are unremarkable. IMPRESSION: No active cardiopulmonary disease. Electronically Signed   By: Anner Crete M.D.   On: 10/16/2016 22:50     CBC  Recent Labs Lab 10/16/16 2215 10/17/16 0345 10/18/16 0753  WBC 8.6 8.0 7.9  HGB 12.6* 11.0* 10.1*  HCT 37.6* 33.9* 30.7*  PLT 164 135* 140*  MCV 91.9 93.1 91.6  MCH 30.8 30.2 30.1  MCHC 33.5 32.4 32.9  RDW 14.7 15.0 14.4  LYMPHSABS 0.8  --   --   MONOABS 0.6  --   --   EOSABS 0.1  --   --   BASOSABS 0.0  --   --     Chemistries   Recent Labs Lab 10/16/16 2215 10/17/16 0345 10/18/16 0753  NA 136 133* 133*  K 4.2 5.5* 4.6  CL 90* 93* 93*  CO2 _0 GLUCOSE 189* 242* 129*  BUN 23* 30* 44*  CREATININE 8.18* 9.45* 10.19*  CALCIUM 8.7* 7.5* 7.9*  AST 32  --  20  ALT 37  --  27  ALKPHOS 168*  --  129*  BILITOT 0.7  --  0.6   ------------------------------------------------------------------------------------------------------------------ estimated creatinine clearance is 8.2 mL/min (A) (by C-G formula based on SCr of 10.19 mg/dL (H)). ------------------------------------------------------------------------------------------------------------------ No results for input(s): HGBA1C in the last 72  hours. ------------------------------------------------------------------------------------------------------------------ No results for input(s): CHOL, HDL, LDLCALC, TRIG, CHOLHDL, LDLDIRECT in the last 72 hours. ------------------------------------------------------------------------------------------------------------------ No results for input(s): TSH, T4TOTAL, T3FREE, THYROIDAB in the last 72 hours.  Invalid input(s): FREET3 ------------------------------------------------------------------------------------------------------------------ No results for input(s): VITAMINB12, FOLATE, FERRITIN, TIBC, IRON, RETICCTPCT in the last 72 hours.  Coagulation profile  Recent Labs Lab 10/16/16 2215  INR 1.05    No results for input(s): DDIMER in the last 72 hours.  Cardiac Enzymes No results for input(s): CKMB, TROPONINI, MYOGLOBIN in the last 168 hours.  Invalid input(s): CK ------------------------------------------------------------------------------------------------------------------ Invalid input(s): POCBNP   CBG:  Recent Labs Lab 10/16/16 2119 10/17/16 1302 10/17/16 1635 10/17/16 2117  GLUCAP 165* 127* 86 210*       Studies: Dg Chest 2 View  Result Date: 10/16/2016 CLINICAL DATA:  61 year old male with  tachycardia. EXAM: CHEST  2 VIEW COMPARISON:  Chest radiograph dated 05/16/2013 FINDINGS: The heart size and mediastinal contours are within normal limits. Both lungs are clear. The visualized skeletal structures are unremarkable. IMPRESSION: No active cardiopulmonary disease. Electronically Signed   By: Anner Crete M.D.   On: 10/16/2016 22:50      Lab Results  Component Value Date   HGBA1C 12.3 (H) 08/29/2012   HGBA1C (H) 06/18/2010    8.6 (NOTE)                                                                       According to the ADA Clinical Practice Recommendations for 2011, when HbA1c is used as a screening test:   >=6.5%   Diagnostic of Diabetes  [place]           (if abnormal result  is confirmed)  5.7-6.4%   Increased risk of developing Diabetes Mellitus  References:Diagnosis and Classification of Diabetes Mellitus,Diabetes ZGFU,8347,58(VEXOG 1):S62-S69 and Standards of Medical Care in         Diabetes - 2011,Diabetes Care,2011,34  (Suppl 1):S11-S61.   HGBA1C 11.6 (H) 07/29/2009   Lab Results  Component Value Date   MICROALBUR 55.03 (H) 11/30/2008   LDLCALC 119 (H) 08/29/2012   CREATININE 10.19 (H) 10/18/2016       Scheduled Meds: . calcitRIOL  1 mcg Oral Q M,W,F-HD  . cefTAZidime (FORTAZ)  IV  2 g Intravenous Q M,W,F-2000  . cinacalcet  120 mg Oral Q M,W,F-1800  . insulin aspart  0-9 Units Subcutaneous TID WC  . insulin glargine  20 Units Subcutaneous QHS  . multivitamin  1 tablet Oral QHS  . prednisoLONE acetate  1 drop Both Eyes QHS  . sevelamer carbonate  1,600 mg Oral TID WC  . sodium chloride flush  3 mL Intravenous Q12H  . vancomycin  750 mg Intravenous Q M,W,F-HD   Continuous Infusions:   LOS: 1 day    Time spent: >30 MINS    [facility]  [facility] Pager [phone]. If 7PM-7AM, please contact night-coverage at [email or web address], password [facility] 10/18/2016, 10:30 AM  LOS: 1 day

## 2016-10-18 NOTE — Procedures (Signed)
Fevers better, urine cx showing GNR's, final ID pending.  On HD now.    I was present at this dialysis session, have reviewed the session itself and made  appropriate changes Kelly Splinter MD [place] pager [phone]   10/18/2016, 11:57 AM

## 2016-10-19 LAB — CBC
HCT: 32.2 % — ABNORMAL LOW (ref 39.0–52.0)
HEMOGLOBIN: 10.5 g/dL — AB (ref 13.0–17.0)
MCH: 30.3 pg (ref 26.0–34.0)
MCHC: 32.6 g/dL (ref 30.0–36.0)
MCV: 92.8 fL (ref 78.0–100.0)
Platelets: 177 10*3/uL (ref 150–400)
RBC: 3.47 MIL/uL — AB (ref 4.22–5.81)
RDW: 14.5 % (ref 11.5–15.5)
WBC: 7.5 10*3/uL (ref 4.0–10.5)

## 2016-10-19 LAB — GLUCOSE, CAPILLARY
GLUCOSE-CAPILLARY: 72 mg/dL (ref 65–99)
GLUCOSE-CAPILLARY: 223 mg/dL — AB (ref 65–99)

## 2016-10-19 LAB — URINE CULTURE

## 2016-10-19 MED ORDER — DEXTROSE 5 % IV SOLN: 2.0000 g | INTRAVENOUS | 0 refills | Status: AC

## 2016-10-19 MED ORDER — CALCITRIOL 0.5 MCG PO CAPS: 1.0000 ug | ORAL_CAPSULE | ORAL | 0 refills | Status: DC

## 2016-10-19 MED ORDER — CINACALCET HCL 30 MG PO TABS: 120.0000 mg | ORAL_TABLET | ORAL | 1 refills | Status: DC

## 2016-10-19 NOTE — Progress Notes (Signed)
[facility] Progress Note  Dialysis Orders:  [facility] MWF 4h32min 180F BFR 400/800 2K/2Ca EDW 77.5 kg  -LUE AVF Heparin 7800 U IV -Venofer 50mg  IV q week -Calcitriol 1 mcg PO q HD  -Sensipar 120mg  PO q HD  Assessment/Plan: 1. Hematuria/UTI s/p prostate biopsy  - culture +E. coli - abx per primary  2. ESRD -  MWF - cont on schedule -  3.  Hypertension/volume  - BP controlled no meds on home list/ Post HD wt 78.1 kg  4.  Anemia  - Hgb 10.5 follow no ESA yet  5.  Metabolic bone disease -  Cont VDRA/Renvela binder/Sensipar  6.  Nutrition - Renal diet/vitamins  7. DM - per primary    Subjective: Feels much better. No complaints today. Thinks he may go home today.   Objective Vitals:   10/18/16 1151 10/18/16 1701 10/18/16 2117 10/19/16 0420  BP: (!) 167/75 123/74 (!) 119/54 (!) 115/55  Pulse: 86 97 86 77  Resp: 17 18 16 16   Temp: 98.9 F (37.2 C) 99.5 F (37.5 C) 99.3 F (37.4 C) 98.6 F (37 C)  TempSrc: Oral Oral    SpO2: 100% 98% 99% 100%  Weight:      Height:       Physical Exam General: WNWD AM NAD  Heart: RRR  Lungs: CTAB Extremities: no LE edema Dialysis Access: LUE AVF +bruit/thrill    Lynnda Child PA-C [facility] Pager [phone] 10/19/2016,9:17 AM  LOS: 2 days   Pt seen, examined and agree w A/P as above.  Kelly Splinter MD [facility] pager [phone]   10/19/2016, 3:28 PM    Additional Objective Labs: Basic Metabolic Panel:  Recent Labs Lab 10/16/16 2215 10/17/16 0345 10/18/16 0753  NA 136 133* 133*  K 4.2 5.5* 4.6  CL 90* 93* 93*  CO2 29 27 27   GLUCOSE 189* 242* 129*  BUN 23* 30* 44*  CREATININE 8.18* 9.45* 10.19*  CALCIUM 8.7* 7.5* 7.9*   Liver Function Tests:  Recent Labs Lab 10/16/16 2215 10/18/16 0753  AST 32 20  ALT 37 27  ALKPHOS 168* 129*  BILITOT 0.7 0.6  PROT 7.8 6.1*  ALBUMIN 3.2* 2.4*    Recent Labs Lab 10/16/16 2215  LIPASE 29   CBC:  Recent Labs Lab  10/16/16 2215 10/17/16 0345 10/18/16 0753 10/19/16 0559  WBC 8.6 8.0 7.9 7.5  NEUTROABS 7.2  --   --   --   HGB 12.6* 11.0* 10.1* 10.5*  HCT 37.6* 33.9* 30.7* 32.2*  MCV 91.9 93.1 91.6 92.8  PLT 164 135* 140* 177   Blood Culture    Component Value Date/Time   SDES BLOOD LEFT ARM 10/16/2016 2215   SPECREQUEST IN PEDIATRIC BOTTLE 4CC 10/16/2016 2215   CULT NO GROWTH 2 DAYS 10/16/2016 2215   REPTSTATUS PENDING 10/16/2016 2215    Cardiac Enzymes: No results for input(s): CKTOTAL, CKMB, CKMBINDEX, TROPONINI in the last 168 hours. CBG:  Recent Labs Lab 10/17/16 1635 10/17/16 2117 10/18/16 1149 10/18/16 1701 10/19/16 0756  GLUCAP 86 210* 136* 135* 72   Iron Studies: No results for input(s): IRON, TIBC, TRANSFERRIN, FERRITIN in the last 72 hours. Lab Results  Component Value Date   INR 1.05 10/16/2016   Medications:  . calcitRIOL  1 mcg Oral Q M,W,F-HD  . cefTAZidime (FORTAZ)  IV  2 g Intravenous Q M,W,F-2000  . cinacalcet  120 mg Oral Q M,W,F-1800  . heparin subcutaneous  5,000 Units Subcutaneous Q8H  .  insulin aspart  0-9 Units Subcutaneous TID WC  . insulin glargine  20 Units Subcutaneous QHS  . multivitamin  1 tablet Oral QHS  . prednisoLONE acetate  1 drop Both Eyes QHS  . sevelamer carbonate  1,600 mg Oral TID WC  . sodium chloride flush  3 mL Intravenous Q12H  . vancomycin  750 mg Intravenous Q M,W,F-HD

## 2016-10-19 NOTE — Discharge Summary (Signed)
Physician Discharge Summary  Charles [name] MRN: [id number] DOB/AGE: September 29, 1955 61 y.o.  PCP: [facility]   Admit date: 10/16/2016 Discharge date: 10/19/2016  Discharge Diagnoses:    Principal Problem:   Complicated UTI (urinary tract infection) Active Problems:   Diabetes mellitus, type 2 ([place])   Essential hypertension   End stage renal disease ([place])   Sepsis ([place])   Acute cystitis with hematuria    Follow-up recommendations Follow-up with PCP in 3-5 days , including all  additional recommended appointments as below Follow-up CBC, CMP in 3-5 days Patient to continue with [place] with hemodialysis, 4 more doses starting 3/21      Current Discharge Medication List    START taking these medications   Details  calcitRIOL (ROCALTROL) 0.5 MCG capsule Take 2 capsules (1 mcg total) by mouth every Monday, Wednesday, and Friday with hemodialysis. Qty: 30 capsule, Refills: 0    cefTAZidime 2 g in dextrose 5 % 50 mL Inject 2 g into the vein every Monday, Wednesday, and Friday at 8 PM. Qty: 4 ampule, Refills: 0    cinacalcet (SENSIPAR) 30 MG tablet Take 4 tablets (120 mg total) by mouth every Monday, Wednesday, and Friday at 6 PM. Qty: 60 tablet, Refills: 1      CONTINUE these medications which have NOT CHANGED   Details  acetaminophen (TYLENOL) 500 MG tablet Take 1,000 mg by mouth every 6 (six) hours as needed for moderate pain.     insulin glargine (LANTUS) 100 UNIT/ML injection Inject 25 Units into the skin at bedtime.    multivitamin (RENA-VIT) TABS tablet Take 1 tablet by mouth daily.    prednisoLONE acetate (PRED FORTE) 1 % ophthalmic suspension Place 1 drop into both eyes at bedtime.  Refills: 1    sevelamer carbonate (RENVELA) 800 MG tablet Take 800 mg by mouth 3 (three) times daily with meals.          Discharge Condition: Stable   Discharge Instructions Get Medicines reviewed and adjusted: Please take all your medications with you for your next  visit with your Primary MD  Please request your Primary MD to go over all hospital tests and procedure/radiological results at the follow up, please ask your Primary MD to get all Hospital records sent to his/her office.  If you experience worsening of your admission symptoms, develop shortness of breath, life threatening emergency, suicidal or homicidal thoughts you must seek medical attention immediately by calling 911 or calling your MD immediately if symptoms less severe.  You must read complete instructions/literature along with all the possible adverse reactions/side effects for all the Medicines you take and that have been prescribed to you. Take any new Medicines after you have completely understood and accpet all the possible adverse reactions/side effects.   Do not drive when taking Pain medications.   Do not take more than prescribed Pain, Sleep and Anxiety Medications  Special Instructions: If you have smoked or chewed Tobacco in the last 2 yrs please stop smoking, stop any regular Alcohol and or any Recreational drug use.  Wear Seat belts while driving.  Please note  You were cared for by a hospitalist during your hospital stay. Once you are discharged, your primary care physician will handle any further medical issues. Please note that NO REFILLS for any discharge medications will be authorized once you are discharged, as it is imperative that you return to your primary care physician (or establish a relationship with a primary care physician if you do not  have one) for your aftercare needs so that they can reassess your need for medications and monitor your lab values.     No Known Allergies    Disposition: 01-Home or Self Care   Consults: Nephrology    Significant Diagnostic Studies:  Dg Chest 2 View  Result Date: 10/16/2016 CLINICAL DATA:  61 year old male with tachycardia. EXAM: CHEST  2 VIEW COMPARISON:  Chest radiograph dated 05/16/2013 FINDINGS: The heart  size and mediastinal contours are within normal limits. Both lungs are clear. The visualized skeletal structures are unremarkable. IMPRESSION: No active cardiopulmonary disease. Electronically Signed   By: Anner Crete M.D.   On: 10/16/2016 22:50       Filed Weights   10/17/16 2113 10/18/16 0800 10/18/16 1109  Weight: 79.8 kg (176 lb) 79.5 kg (175 lb 4.3 oz) 78.1 kg (172 lb 2.9 oz)     Microbiology: Recent Results (from the past 240 hour(s))  Blood culture (routine x 2)     Status: None (Preliminary result)   Collection Time: 10/16/16  9:30 PM  Result Value Ref Range Status   Specimen Description BLOOD LEFT FOREARM  Final   Special Requests BOTTLES DRAWN AEROBIC ONLY 5CC  Final   Culture NO GROWTH 2 DAYS  Final   Report Status PENDING  Incomplete  Urine culture     Status: Abnormal (Preliminary result)   Collection Time: 10/16/16 10:04 PM  Result Value Ref Range Status   Specimen Description URINE, RANDOM  Final   Special Requests NONE  Final   Culture (A)  Final    >=100,000 COLONIES/mL ESCHERICHIA COLI CULTURE REINCUBATED FOR BETTER GROWTH    Report Status PENDING  Incomplete   Organism ID, Bacteria ESCHERICHIA COLI (A)  Final      Susceptibility   Escherichia coli - MIC*    AMPICILLIN >=32 RESISTANT Resistant     CEFAZOLIN <=4 SENSITIVE Sensitive     CEFTRIAXONE <=1 SENSITIVE Sensitive     CIPROFLOXACIN >=4 RESISTANT Resistant     GENTAMICIN >=16 RESISTANT Resistant     IMIPENEM <=0.25 SENSITIVE Sensitive     NITROFURANTOIN <=16 SENSITIVE Sensitive     TRIMETH/SULFA >=320 RESISTANT Resistant     AMPICILLIN/SULBACTAM 4 SENSITIVE Sensitive     PIP/TAZO <=4 SENSITIVE Sensitive     Extended ESBL NEGATIVE Sensitive     * >=100,000 COLONIES/mL ESCHERICHIA COLI  Blood culture (routine x 2)     Status: None (Preliminary result)   Collection Time: 10/16/16 10:15 PM  Result Value Ref Range Status   Specimen Description BLOOD LEFT ARM  Final   Special Requests IN  PEDIATRIC BOTTLE 4CC  Final   Culture NO GROWTH 2 DAYS  Final   Report Status PENDING  Incomplete       Blood Culture    Component Value Date/Time   SDES BLOOD LEFT ARM 10/16/2016 2215   SPECREQUEST IN PEDIATRIC BOTTLE 4CC 10/16/2016 2215   CULT NO GROWTH 2 DAYS 10/16/2016 2215   REPTSTATUS PENDING 10/16/2016 2215      Labs: Results for orders placed or performed during the hospital encounter of 10/16/16 (from the past 48 hour(s))  Glucose, capillary     Status: Abnormal   Collection Time: 10/17/16  1:02 PM  Result Value Ref Range   Glucose-Capillary 127 (H) 65 - 99 mg/dL  Glucose, capillary     Status: None   Collection Time: 10/17/16  4:35 PM  Result Value Ref Range   Glucose-Capillary 86 65 - 99 mg/dL  Glucose, capillary     Status: Abnormal   Collection Time: 10/17/16  9:17 PM  Result Value Ref Range   Glucose-Capillary 210 (H) 65 - 99 mg/dL  Comprehensive metabolic panel     Status: Abnormal   Collection Time: 10/18/16  7:53 AM  Result Value Ref Range   Sodium 133 (L) 135 - 145 mmol/L   Potassium 4.6 3.5 - 5.1 mmol/L   Chloride 93 (L) 101 - 111 mmol/L   CO2 27 22 - 32 mmol/L   Glucose, Bld 129 (H) 65 - 99 mg/dL   BUN 44 (H) 6 - 20 mg/dL   Creatinine, Ser 10.19 (H) 0.61 - 1.24 mg/dL   Calcium 7.9 (L) 8.9 - 10.3 mg/dL   Total Protein 6.1 (L) 6.5 - 8.1 g/dL   Albumin 2.4 (L) 3.5 - 5.0 g/dL   AST 20 15 - 41 U/L   ALT 27 17 - 63 U/L   Alkaline Phosphatase 129 (H) 38 - 126 U/L   Total Bilirubin 0.6 0.3 - 1.2 mg/dL   GFR calc non Af Amer 5 (L) >60 mL/min   GFR calc Af Amer 6 (L) >60 mL/min    Comment: (NOTE) The eGFR has been calculated using the CKD EPI equation. This calculation has not been validated in all clinical situations. eGFR's persistently <60 mL/min signify possible Chronic Kidney Disease.    Anion gap 13 5 - 15  CBC     Status: Abnormal   Collection Time: 10/18/16  7:53 AM  Result Value Ref Range   WBC 7.9 4.0 - 10.5 K/uL   RBC 3.35 (L) 4.22 -  5.81 MIL/uL   Hemoglobin 10.1 (L) 13.0 - 17.0 g/dL   HCT 30.7 (L) 39.0 - 52.0 %   MCV 91.6 78.0 - 100.0 fL   MCH 30.1 26.0 - 34.0 pg   MCHC 32.9 30.0 - 36.0 g/dL   RDW 14.4 11.5 - 15.5 %   Platelets 140 (L) 150 - 400 K/uL  Glucose, capillary     Status: Abnormal   Collection Time: 10/18/16 11:49 AM  Result Value Ref Range   Glucose-Capillary 136 (H) 65 - 99 mg/dL  Glucose, capillary     Status: Abnormal   Collection Time: 10/18/16  5:01 PM  Result Value Ref Range   Glucose-Capillary 135 (H) 65 - 99 mg/dL  CBC     Status: Abnormal   Collection Time: 10/19/16  5:59 AM  Result Value Ref Range   WBC 7.5 4.0 - 10.5 K/uL   RBC 3.47 (L) 4.22 - 5.81 MIL/uL   Hemoglobin 10.5 (L) 13.0 - 17.0 g/dL   HCT 32.2 (L) 39.0 - 52.0 %   MCV 92.8 78.0 - 100.0 fL   MCH 30.3 26.0 - 34.0 pg   MCHC 32.6 30.0 - 36.0 g/dL   RDW 14.5 11.5 - 15.5 %   Platelets 177 150 - 400 K/uL  Glucose, capillary     Status: None   Collection Time: 10/19/16  7:56 AM  Result Value Ref Range   Glucose-Capillary 72 65 - 99 mg/dL     Lipid Panel     Component Value Date/Time   CHOL 207 (H) 08/29/2012 1812   TRIG 193 (H) 08/29/2012 1812   HDL 49 08/29/2012 1812   CHOLHDL 4.2 08/29/2012 1812   VLDL 39 08/29/2012 1812   LDLCALC 119 (H) 08/29/2012 1812     Lab Results  Component Value Date   HGBA1C 12.3 (H) 08/29/2012   HGBA1C (H)  06/18/2010    8.6 (NOTE)                                                                       According to the ADA Clinical Practice Recommendations for 2011, when HbA1c is used as a screening test:   >=6.5%   Diagnostic of Diabetes Mellitus           (if abnormal result  is confirmed)  5.7-6.4%   Increased risk of developing Diabetes Mellitus  References:Diagnosis and Classification of Diabetes Mellitus,Diabetes [id number],53(GUYQI 1):S62-S69 and Standards of Medical Care in         Diabetes - 2011,Diabetes [id number],63  (Suppl 1):S11-S61.   HGBA1C 11.6 (H) 07/29/2009     Lab  Results  Component Value Date   MICROALBUR 55.03 (H) 11/30/2008   LDLCALC 119 (H) 08/29/2012   CREATININE 10.19 (H) 10/18/2016     HPI :  61 y.o.malewith medical history significant of hypertension, hyperlipidemia, diabetes mellitus, ESRD-HD (MWF), glaucoma, s/p of recent prostate biopsy 5 days ago, whopresents with hematuria, dysuria, fever.Procalcitonin markedly elevated. Follow urine culture blood culture results. Continue with broad-spectrum antibiotics\  HOSPITAL COURSE:   Sepsis due to complicated UTI:  Patient has hematuria, dysuria and burning on urination, plus positive urinalysis, consistentwith UTI. Complicated UTI secondary to s/p of prostate biopsy . Patient met criteria for sepsis on admission with fever, tachycardia and tachypnea . Lactate is normal. Currently hemodynamically stable. No leukocytosis. Started on Fortaz and vancomycin Blood culture no growth so far, urine culture shows Escherichia coli Pro calcitonin markedly elevated Patient will continue with [place] with hemodialysis, 4 more doses remaining    DM-II:Last A1c 12.3 on 08/30/15,  Controlled in the hospital Continue Lantus    HTN:not on Bp meds -IV hydralazine when necessary  End stage renal disease-HD (MWF): potassium 4.2, bicarbonate 29, creatinine 8.18. Pt could not complete full course of HD on Monday due to acute sickness. -continue renvela Nephrology following ESRD - MWF - cont on schedule     Discharge Exam:   Blood pressure (!) 115/55, pulse 77, temperature 98.6 F (37 C), resp. rate 16, height _0  (1.778 m), weight 78.1 kg (172 lb 2.9 oz), SpO2 100 %.  General exam: Appears calm and comfortable  Respiratory system: Clear to auscultation. Respiratory effort normal. Cardiovascular system: S1 & S2 heard, RRR. No JVD, murmurs, rubs, gallops or clicks. No pedal edema. Gastrointestinal system: Abdomen is nondistended, soft and nontender. No organomegaly or masses felt. Normal  bowel sounds heard. Central nervous system: Alert and oriented. No focal neurological deficits. Extremities: Symmetric 5 x 5 power. Skin: No rashes, lesions or ulcers Psychiatry: Judgement and insight appear normal. Mood & affect appropriate.     Follow-up Information    [facility]. Call.   Specialty:  Internal Medicine Why:  Hospital follow-up, Contact information: [place] [address] [phone]           Signed: Reyne Dumas 10/19/2016, 10:06 AM        Time spent >45 mins

## 2016-10-19 NOTE — Progress Notes (Signed)
Charles [name] to be D/C'd Home per MD order.  Discussed prescriptions and follow up appointments with the patient. Prescriptions given to patient, medication list explained in detail. Pt verbalized understanding.  Allergies as of 10/19/2016   No Known Allergies     Medication List    TAKE these medications   acetaminophen 500 MG tablet Commonly known as:  TYLENOL Take 1,000 mg by mouth every 6 (six) hours as needed for moderate pain.   calcitRIOL 0.5 MCG capsule Commonly known as:  ROCALTROL Take 2 capsules (1 mcg total) by mouth every Monday, Wednesday, and Friday with hemodialysis. Start taking on:  10/20/2016   cefTAZidime 2 g in dextrose 5 % 50 mL Inject 2 g into the vein every Monday, Wednesday, and Friday at 8 PM. Start taking on:  10/20/2016   cinacalcet 30 MG tablet Commonly known as:  SENSIPAR Take 4 tablets (120 mg total) by mouth every Monday, Wednesday, and Friday at 6 PM. Start taking on:  10/20/2016   insulin glargine 100 UNIT/ML injection Commonly known as:  LANTUS Inject 25 Units into the skin at bedtime.   multivitamin Tabs tablet Take 1 tablet by mouth daily.   prednisoLONE acetate 1 % ophthalmic suspension Commonly known as:  PRED FORTE Place 1 drop into both eyes at bedtime.   sevelamer carbonate 800 MG tablet Commonly known as:  RENVELA Take 800 mg by mouth 3 (three) times daily with meals.       Vitals:   10/19/16 0420 10/19/16 1020  BP: (!) 115/55 139/75  Pulse: 77 97  Resp: 16 16  Temp: 98.6 F (37 C) 98.8 F (37.1 C)    Skin clean, dry and intact without evidence of skin break down, no evidence of skin tears noted. IV catheter discontinued intact. Site without signs and symptoms of complications. Dressing and pressure applied. Pt denies pain at this time. No complaints noted.  An After Visit Summary was printed and given to the patient. Patient ambulated off unit, and D/C home via private auto.  Retta Mac BSN, RN

## 2016-10-21 LAB — CULTURE, BLOOD (ROUTINE X 2)
CULTURE: NO GROWTH
Culture: NO GROWTH

## 2017-02-15 ENCOUNTER — Telehealth: Payer: Self-pay | Admitting: Physician Assistant

## 2017-02-15 NOTE — Telephone Encounter (Signed)
Received records from [facility] for appointment on 03/05/17 with Almyra Deforest, PA.  Records put with Hao's schedule for 03/05/17. lp

## 2017-02-28 HISTORY — PX: KIDNEY TRANSPLANT: SHX239

## 2017-03-05 ENCOUNTER — Ambulatory Visit: Payer: Medicare Other | Admitting: Physician Assistant

## 2017-04-06 DIAGNOSIS — Z940 Kidney transplant status: Secondary | ICD-10-CM | POA: Insufficient documentation

## 2017-04-13 DIAGNOSIS — D849 Immunodeficiency, unspecified: Secondary | ICD-10-CM | POA: Insufficient documentation

## 2017-04-16 DIAGNOSIS — Z79899 Other long term (current) drug therapy: Secondary | ICD-10-CM | POA: Insufficient documentation

## 2017-04-16 DIAGNOSIS — Z940 Kidney transplant status: Secondary | ICD-10-CM | POA: Insufficient documentation

## 2017-05-17 DIAGNOSIS — B258 Other cytomegaloviral diseases: Secondary | ICD-10-CM | POA: Insufficient documentation

## 2017-08-01 DIAGNOSIS — T8611 Kidney transplant rejection: Secondary | ICD-10-CM | POA: Insufficient documentation

## 2017-10-03 DIAGNOSIS — B348 Other viral infections of unspecified site: Secondary | ICD-10-CM | POA: Insufficient documentation

## 2018-07-23 ENCOUNTER — Encounter: Payer: Self-pay | Admitting: Internal Medicine

## 2018-07-23 ENCOUNTER — Ambulatory Visit (INDEPENDENT_AMBULATORY_CARE_PROVIDER_SITE_OTHER): Payer: Medicare Other | Admitting: Internal Medicine

## 2018-07-23 VITALS — BP 150/82 | HR 66 | Ht 70.0 in | Wt 214.4 lb

## 2018-07-23 DIAGNOSIS — E1165 Type 2 diabetes mellitus with hyperglycemia: Secondary | ICD-10-CM

## 2018-07-23 DIAGNOSIS — Z940 Kidney transplant status: Secondary | ICD-10-CM

## 2018-07-23 DIAGNOSIS — Z794 Long term (current) use of insulin: Secondary | ICD-10-CM | POA: Diagnosis not present

## 2018-07-23 MED ORDER — INSULIN GLARGINE 100 UNIT/ML ~~LOC~~ SOLN: 25.0000 [IU] | Freq: Every day | SUBCUTANEOUS | 6 refills | Status: DC

## 2018-07-23 MED ORDER — INSULIN ASPART 100 UNIT/ML FLEXPEN: 8.0000 [IU] | PEN_INJECTOR | Freq: Three times a day (TID) | SUBCUTANEOUS | 11 refills | Status: DC

## 2018-07-23 NOTE — Progress Notes (Signed)
Name: Charles [name]  MRN/ DOB: [id number], [birth year]   Age/ Sex: 62 y.o., male    PCP: [facility], [facility]   Reason for Endocrinology Evaluation: Type 2 Diabetes Mellitus     Date of Initial Endocrinology Visit: 07/24/2018     PATIENT IDENTIFIER: Charles [name] is a 62 y.o. male with a past medical history of HTN, T2DM, ESRD on HD since 2015 (S/P transplant in 9/ 2018) . The patient presented for initial endocrinology clinic visit on 07/24/2018 for consultative assistance with his diabetes management.    HPI: Mr. Dyke was    Diagnosed with T2DM ~ 10 yrs Prior Medications tried/Intolerance: Metformin, glimepiride , insulin was started in 2012 (lantus, levemir) Currently checking blood sugars 3 x / day,  before meals   Hypoglycemia episodes : yes               Symptoms:   Sweating      Frequency: 2/month - during the day  Hemoglobin A1c has ranged from 7.5% in 2008, peaking at 12.3% in 2014. Patient required assistance for hypoglycemia: 1 x in 2016 Patient has required hospitalization within the last 1 year from hyper or hypoglycemia: no  In terms of diet, the patient drinks sugar sweetened beverages and snacks between meals.    HOME DIABETES REGIMEN: Basaglar  25 units BID  Novolog SS average 12-14 units per meal   Statin:No ACE-I/ARB:ON HD Prior Diabetic Education: no   METER DOWNLOAD SUMMARY: Did not bring meter    DIABETIC COMPLICATIONS: Microvascular complications:   ESRD, S/P Renal transplant   Denies: retinopathy   Last eye exam: Completed 05/2018  Macrovascular complications:    Denies: CAD, PVD, CVA   PAST HISTORY: Past Medical History:  Past Medical History:  Diagnosis Date  . Anemia   . Diabetes mellitus without complication (HCC)    Type 2   . End stage kidney disease ([place])    HD   M-W-F  . Glaucoma   . High cholesterol   . Hypertension    Past Surgical History:  Past Surgical History:  Procedure Laterality Date  .  APPENDECTOMY    . AV FISTULA PLACEMENT Right 05/19/2013   Procedure: RIGHT BRACHIOCEPHALIC  ARTERIOVENOUS (AV) FISTULA CREATION;  Surgeon: Conrad Bellechester, MD;  Location: [facility];  Service: Vascular;  Laterality: Right;  . EYE SURGERY    . MULTIPLE EXTRACTIONS WITH ALVEOLOPLASTY N/A 01/28/2015   Procedure: Extraction of tooth #'s 2,1,30,86,57,84 with alveoloplasty and gross debridement of remaining teeth.;  Surgeon: Lenn Cal, DDS;  Location: [facility];  Service: Oral Surgery;  Laterality: N/A;  . PROSTATE BIOPSY        Social History:  reports that he has never smoked. He has never used smokeless tobacco. He reports that he does not drink alcohol or use drugs. Family History:  Family History  Problem Relation Age of Onset  . Diabetes Mother   . Colon cancer Neg Hx      HOME MEDICATIONS: Allergies as of 07/23/2018   No Known Allergies     Medication List       Accurate as of July 23, 2018 11:59 PM. Always use your most recent med list.        acetaminophen 500 MG tablet Commonly known as:  TYLENOL Take 1,000 mg by mouth every 6 (six) hours as needed for moderate pain.   amLODipine 10 MG tablet Commonly known as:  NORVASC Take 10 mg by mouth daily. Take  1 tablet PO daily   B-complex with vitamin C tablet Take 1 tablet by mouth daily. Take 0.8 mg PO daily   glucose blood test strip 1 each by Other route 4 (four) times daily. Use as instructed QID before a meal and nightly to obtain glucose reading.   hydrALAZINE 50 MG tablet Commonly known as:  APRESOLINE Take 50 mg by mouth 2 (two) times daily. Take 2 tablet PO BID   insulin aspart 100 UNIT/ML FlexPen Commonly known as:  NOVOLOG FLEXPEN Inject 8 Units into the skin 3 (three) times daily with meals.   insulin glargine 100 UNIT/ML injection Commonly known as:  LANTUS Inject 0.25 mLs (25 Units total) into the skin at bedtime.   metoprolol tartrate 100 MG tablet Commonly known as:  LOPRESSOR Take 100 mg by  mouth 2 (two) times daily. Take 1 table PO BID   multivitamin Tabs tablet Take 1 tablet by mouth daily.   mycophenolate 180 MG EC tablet Commonly known as:  MYFORTIC Take 180 mg by mouth 2 (two) times daily. Take 3 table (540mg  total)  PO BID   omeprazole 20 MG capsule Commonly known as:  PRILOSEC Take 20 mg by mouth daily. Take 1 capsule PO   ONETOUCH DELICA LANCETS 92E Misc by Does not apply route 4 (four) times daily. Check blood sugar QID before and meal and nightly   ONETOUCH PING METER REMOTE Supplies Misc by Does not apply route. Use as instructed to check blood sugar QID   PEN NEEDLES 31GX5/16" 31G X 8 MM Misc by Does not apply route 4 (four) times daily. 4 times daily with meals and nightly. To administer insulin by pen   pravastatin 20 MG tablet Commonly known as:  PRAVACHOL Take 20 mg by mouth daily. Take 1 tablet PO daily   predniSONE 5 MG tablet Commonly known as:  DELTASONE Take 5 mg by mouth daily with breakfast. Take 1 tablet PO daily   sodium bicarbonate 650 MG tablet Take 650 mg by mouth 2 (two) times daily. Take on table PO BID   sulfamethoxazole-trimethoprim 400-80 MG tablet Commonly known as:  BACTRIM,SEPTRA Take 1 tablet by mouth 3 (three) times a week. Take 1 tablet PO every Monday, Wednesday, Friday   tacrolimus 1 MG capsule Commonly known as:  PROGRAF Take 1 mg by mouth 2 (two) times daily. Take 1 capsule 1mg  PO BID. Take with 5mg  for total of 6 mg   tacrolimus 5 MG capsule Commonly known as:  PROGRAF Take 5 mg by mouth 2 (two) times daily. Take 1 capsule 5 mg PO BID. Take with 1 mg tab for total of 6 mg        ALLERGIES: No Known Allergies   REVIEW OF SYSTEMS: A comprehensive ROS was conducted with the patient and is negative except as per HPI and below:  Review of Systems  Constitutional: Negative for malaise/fatigue and weight loss.  HENT: Negative for congestion and sore throat.   Eyes: Negative for blurred vision and pain.    Respiratory: Negative for cough and shortness of breath.   Cardiovascular: Negative for chest pain and palpitations.  Gastrointestinal: Negative for diarrhea and nausea.  Genitourinary: Negative for frequency.  Skin: Negative.   Neurological: Negative for tingling and tremors.  Endo/Heme/Allergies: Negative for polydipsia.  Psychiatric/Behavioral: Negative for depression. The patient is not nervous/anxious.       OBJECTIVE:   VITAL SIGNS: BP (!) 150/82 (BP Location: Left Arm, Patient Position: Sitting, Cuff Size: Normal)  Pulse 66   Ht 5\' 10"  (1.778 m)   Wt 214 lb 6.4 oz (97.3 kg)   BMI 30.76 kg/m    PHYSICAL EXAM:  General: Pt appears well and is in NAD  Hydration: Well-hydrated with moist mucous membranes and good skin turgor  HEENT: Head: Unremarkable with good dentition. Oropharynx clear without exudate.  Eyes: External eye exam normal without stare, lid lag or exophthalmos.  EOM intact.    Neck: General: Supple without adenopathy or carotid bruits. Thyroid: Thyroid size normal.  No goiter or nodules appreciated. No thyroid bruit.  Lungs: Clear with good BS bilat with no rales, rhonchi, or wheezes  Heart: RRR with normal S1 and S2 and no gallops; no murmurs; no rub  Abdomen: Normoactive bowel sounds, soft, nontender, without masses or organomegaly palpable  Extremities:  Lower extremities - No pretibial edema. No lesions.  Skin: Normal texture and temperature to palpation. No rash noted. No Acanthosis nigricans/skin tags. No lipohypertrophy.  Neuro: MS is good with appropriate affect, pt is alert and Ox3    DM foot exam:  The skin of the feet is intact without sores or ulcerations. The pedal pulses are 2+ on right and 2+ on left. The sensation is intact to a screening 5.07, 10 gram monofilament bilaterally   DATA REVIEWED:  Lab Results  Component Value Date   HGBA1C 12.3 (H) 08/29/2012   HGBA1C (H) 06/18/2010    8.6 (NOTE)                                                                        According to the ADA Clinical Practice Recommendations for 2011, when HbA1c is used as a screening test:   >=6.5%   Diagnostic of Diabetes Mellitus           (if abnormal result  is confirmed)  5.7-6.4%   Increased risk of developing Diabetes Mellitus  References:Diagnosis and Classification of Diabetes Mellitus,Diabetes FAOZ,3086,57(QIONG 1):S62-S69 and Standards of Medical Care in         Diabetes - 2011,Diabetes Care,2011,34  (Suppl 1):S11-S61.   HGBA1C 11.6 (H) 07/29/2009   Lab Results  Component Value Date   MICROALBUR 55.03 (H) 11/30/2008   LDLCALC 119 (H) 08/29/2012   CREATININE 10.19 (H) 10/18/2016   Lab Results  Component Value Date   MICRALBCREAT 169.5 (H) 06/15/2007    Lab Results  Component Value Date   CHOL 207 (H) 08/29/2012   HDL 49 08/29/2012   LDLCALC 119 (H) 08/29/2012   TRIG 193 (H) 08/29/2012   CHOLHDL 4.2 08/29/2012       06/24/18  A1c 10.3%  Gluc 217 mg/dL BUN 16 mg/dL Cr. 1.12 mg/dL GFR 82 mL/min/1.73    ASSESSMENT / PLAN / RECOMMENDATIONS:   1) Type 2 Diabetes Mellitus, poorly controlled, With Hx of ESRD , S/P renal transplant in 2018  - Most recent A1c of 10.3 %. Goal A1c < 7.0 %.    Plan: GENERAL: I have discussed with the patient the pathophysiology of diabetes. We went over the natural progression of the disease. We talked about both insulin resistance and insulin deficiency. We stressed the importance of lifestyle changes including diet and exercise. I explained the complications associated with diabetes including retinopathy, nephropathy,  neuropathy as well as increased risk of cardiovascular disease. We went over the benefit seen with glycemic control.    I explained to the patient that diabetic patients are at higher than normal risk for amputations. The patient was informed that diabetes is the number one cause of non-traumatic amputations in [place].  Discussed pharmacokinetics of basal/bolus insulin and the  importance of taking prandial insulin with meals.   We also discussed avoiding sugar-sweetened beverages and snacks, when possible.   Pt advised about the importance of glucose data in glucose management and adjusting his insulin .    MEDICATIONS: Decrease Basaglar to 25 units once a day  Start Novolog at 8 units with each meal   EDUCATION / INSTRUCTIONS:  BG monitoring instructions: Patient is instructed to check his blood sugars 4 times a day, before meals and bedtime.  Call [facility] if: BG persistently < 70 or > 300. . I reviewed the Rule of 15 for the treatment of hypoglycemia in detail with the patient. Literature supplied.   2) Diabetic complications:   Eye: Does not have known diabetic retinopathy.   Neuro/ Feet: Does not have known diabetic peripheral neuropathy. Renal: Patient has ESRD , was on dialysis from 2012-2018 when he received a renal transplant.   3) Lipids: Patient is on a statin.    4) Hypertension: He is not  at goal of < 140/90 mmHg. Will defer to PCP.       Signed electronically by: Mack Guise, MD  [facility] Endocrinology  [facility] [address] [place], [place] [address] Phone: [phone] FAX: [phone]   CC: [place] [address] [place] [address] Phone: [phone]  Fax: [phone]    Return to Endocrinology clinic as below: Future Appointments  Date Time Provider [place]  08/14/2018  1:00 PM Mariyam Remington, Melanie Crazier, MD [facility] None

## 2018-07-23 NOTE — Patient Instructions (Signed)
-   Check sugar before meals and bedtime  - Decrease Basaglar to 25 units once a day  - Start Novolog at 8 units with each meal - Bring your meter on next visit   HOW TO TREAT LOW BLOOD SUGARS (Blood sugar LESS THAN 70 MG/DL)  Please follow the RULE OF 15 for the treatment of hypoglycemia treatment (when your (blood sugars are less than 70 mg/dL)    STEP 1: Take 15 grams of carbohydrates when your blood sugar is low, which includes:   3-4 GLUCOSE TABS  OR  3-4 OZ OF JUICE OR REGULAR SODA OR  ONE TUBE OF GLUCOSE GEL     STEP 2: RECHECK blood sugar in 15 MINUTES STEP 3: If your blood sugar is still low at the 15 minute recheck --> then, go back to STEP 1 and treat AGAIN with another 15 grams of carbohydrates.

## 2018-07-24 DIAGNOSIS — Z940 Kidney transplant status: Secondary | ICD-10-CM | POA: Insufficient documentation

## 2018-08-14 ENCOUNTER — Ambulatory Visit: Payer: Self-pay | Admitting: Internal Medicine

## 2018-08-20 ENCOUNTER — Ambulatory Visit: Payer: Self-pay | Admitting: Internal Medicine

## 2018-08-29 ENCOUNTER — Ambulatory Visit: Payer: Self-pay | Admitting: Dietician

## 2018-09-03 ENCOUNTER — Ambulatory Visit: Payer: Self-pay | Admitting: Internal Medicine

## 2018-09-03 NOTE — Progress Notes (Deleted)
Name: Charles [name]  Age/ Sex: 63 y.o., male   MRN/ DOB: [id number], 02-21-1956     PCP: Pa, [facility]   Reason for Endocrinology Evaluation: Type {NUMBERS 1 OR 2:522190} Diabetes Mellitus  Initial Endocrine Consultative Visit: ***    PATIENT IDENTIFIER: [name] is a 63 y.o. male with a past medical history of ***. The patient has followed with Endocrinology clinic since *** for consultative assistance with management of his diabetes.  DIABETIC HISTORY:  Charles [name] was diagnosed with DM*** ***, and started insulin therapy approximately *** years after diagnosis. ***. His hemoglobin A1c has ranged from *** in ***, peaking at *** in***.   SUBJECTIVE:   During the last visit (***): ***  Today (09/03/2018): Charles [name]  He checks his blood sugars *** times daily, preprandial to breakfast and ***. The patient has *** had hypoglycemic episodes since the last clinic visit, which typically occur *** x / - most often occuring ***. The patient is *** symptomatic with these episodes, with symptoms of {symptoms; hypoglycemia:9084048}. Otherwise, the patient {HAS/HAS NOT:522402} required any recent emergency interventions for hypoglycemia and {HAS/HAS NOT:522402} had recent hospitalizations secondary to hyper or hypoglycemic episodes.    ROS: As per HPI and as detailed below: ROS    HOME DIABETES REGIMEN:    Statin: *** ACE-I/ARB: *** Prior Diabetic Education: ***   METER DOWNLOAD SUMMARY: Date range evaluated: *** Fingerstick Blood Glucose Tests = *** Average Number Tests/Day = *** Overall Mean FS Glucose = *** Standard Deviation = ***  BG Ranges: Low = *** High = ***   Hypoglycemic Events/30 Days: BG < 50 = *** Episodes of symptomatic severe hypoglycemia = ***    DIABETIC COMPLICATIONS: Microvascular complications:   ***  Denies:   Last Eye Exam: Completed    Macrovascular complications:   ***  Denies: CAD, CVA, PVD   HISTORY:  Past Medical History:  Past Medical History:  Diagnosis Date  . Anemia   . Diabetes mellitus without complication (HCC)    Type 2   . End stage kidney disease ([place])    HD   M-W-F  . Glaucoma   . High cholesterol   . Hypertension     Past Surgical History:  Past Surgical History:  Procedure Laterality Date  . APPENDECTOMY    . AV FISTULA PLACEMENT Right 05/19/2013   Procedure: RIGHT BRACHIOCEPHALIC  ARTERIOVENOUS (AV) FISTULA CREATION;  Surgeon: Conrad Kenedy, MD;  Location: [facility];  Service: Vascular;  Laterality: Right;  . EYE SURGERY    . MULTIPLE EXTRACTIONS WITH ALVEOLOPLASTY N/A 01/28/2015   Procedure: Extraction of tooth #'s 5,2,84,13,24,40 with alveoloplasty and gross debridement of remaining teeth.;  Surgeon: Lenn Cal, DDS;  Location: [facility];  Service: Oral Surgery;  Laterality: N/A;  . PROSTATE BIOPSY       Social History:  reports that he has never smoked. He has never used smokeless tobacco. He reports that he does not drink alcohol or use drugs. Family History:  Family History  Problem Relation Age of Onset  . Diabetes Mother   . Colon cancer Neg Hx       HOME MEDICATIONS: Allergies as of 09/03/2018   No Known Allergies     Medication List       Accurate as of September 03, 2018 12:52 PM. Always use your most recent med list.        acetaminophen 500 MG tablet Commonly known as:  TYLENOL Take 1,000 mg  by mouth every 6 (six) hours as needed for moderate pain.   amLODipine 10 MG tablet Commonly known as:  NORVASC Take 10 mg by mouth daily. Take 1 tablet PO daily   B-complex with vitamin C tablet Take 1 tablet by mouth daily. Take 0.8 mg PO daily   glucose blood test strip 1 each by Other route 4 (four) times daily. Use as instructed QID before a meal and nightly to obtain glucose reading.   hydrALAZINE 50 MG tablet Commonly known as:  APRESOLINE Take 50 mg by  mouth 2 (two) times daily. Take 2 tablet PO BID   insulin aspart 100 UNIT/ML FlexPen Commonly known as:  NOVOLOG FLEXPEN Inject 8 Units into the skin 3 (three) times daily with meals.   insulin glargine 100 UNIT/ML injection Commonly known as:  LANTUS Inject 0.25 mLs (25 Units total) into the skin at bedtime.   metoprolol tartrate 100 MG tablet Commonly known as:  LOPRESSOR Take 100 mg by mouth 2 (two) times daily. Take 1 table PO BID   multivitamin Tabs tablet Take 1 tablet by mouth daily.   mycophenolate 180 MG EC tablet Commonly known as:  MYFORTIC Take 180 mg by mouth 2 (two) times daily. Take 3 table (540mg  total)  PO BID   omeprazole 20 MG capsule Commonly known as:  PRILOSEC Take 20 mg by mouth daily. Take 1 capsule PO   ONETOUCH DELICA LANCETS 87O Misc by Does not apply route 4 (four) times daily. Check blood sugar QID before and meal and nightly   ONETOUCH PING METER REMOTE Supplies Misc by Does not apply route. Use as instructed to check blood sugar QID   PEN NEEDLES 31GX5/16" 31G X 8 MM Misc by Does not apply route 4 (four) times daily. 4 times daily with meals and nightly. To administer insulin by pen   pravastatin 20 MG tablet Commonly known as:  PRAVACHOL Take 20 mg by mouth daily. Take 1 tablet PO daily   predniSONE 5 MG tablet Commonly known as:  DELTASONE Take 5 mg by mouth daily with breakfast. Take 1 tablet PO daily   sodium bicarbonate 650 MG tablet Take 650 mg by mouth 2 (two) times daily. Take on table PO BID   sulfamethoxazole-trimethoprim 400-80 MG tablet Commonly known as:  BACTRIM,SEPTRA Take 1 tablet by mouth 3 (three) times a week. Take 1 tablet PO every Monday, Wednesday, Friday   tacrolimus 1 MG capsule Commonly known as:  PROGRAF Take 1 mg by mouth 2 (two) times daily. Take 1 capsule 1mg  PO BID. Take with 5mg  for total of 6 mg   tacrolimus 5 MG capsule Commonly known as:  PROGRAF Take 5 mg by mouth 2 (two) times daily. Take 1  capsule 5 mg PO BID. Take with 1 mg tab for total of 6 mg        OBJECTIVE:   Vital Signs: There were no vitals taken for this visit.  Wt Readings from Last 3 Encounters:  07/23/18 214 lb 6.4 oz (97.3 kg)  10/18/16 172 lb 2.9 oz (78.1 kg)  02/24/15 165 lb (74.8 kg)     Exam: General: Pt appears well and is in NAD  Hydration: Well-hydrated with moist mucous membranes and good skin turgor  HEENT: Head: Unremarkable with good dentition. Oropharynx clear without exudate.  Eyes: External eye exam normal without stare, lid lag or exophthalmos.  EOM intact.  PERRL.  Neck: General: Supple without adenopathy. Thyroid: Thyroid size normal.  No goiter or  nodules appreciated. No thyroid bruit.  Lungs: Clear with good BS bilat with no rales, rhonchi, or wheezes  Heart: RRR with normal S1 and S2 and no gallops; no murmurs; no rub  Abdomen: Normoactive bowel sounds, soft, nontender, without masses or organomegaly palpable  Extremities: No pretibial edema. No tremor. Normal strength and motion throughout. See detailed diabetic foot exam below.  Skin: Normal texture and temperature to palpation. No rash noted. No Acanthosis nigricans/skin tags. No lipohypertrophy.  Neuro: MS is good with appropriate affect, pt is alert and Ox3    DM foot exam: Please see diabetic assessment flow-sheet detailed below:           DATA REVIEWED:  Lab Results  Component Value Date   HGBA1C 12.3 (H) 08/29/2012   HGBA1C (H) 06/18/2010    8.6 (NOTE)                                                                       According to the ADA Clinical Practice Recommendations for 2011, when HbA1c is used as a screening test:   >=6.5%   Diagnostic of Diabetes Mellitus           (if abnormal result  is confirmed)  5.7-6.4%   Increased risk of developing Diabetes Mellitus  References:Diagnosis and Classification of Diabetes Mellitus,Diabetes ELFY,1017,51(WCHEN 1):S62-S69 and Standards of Medical Care in          Diabetes - 2011,Diabetes Care,2011,34  (Suppl 1):S11-S61.   HGBA1C 11.6 (H) 07/29/2009   Lab Results  Component Value Date   MICROALBUR 55.03 (H) 11/30/2008   LDLCALC 119 (H) 08/29/2012   CREATININE 10.19 (H) 10/18/2016   Lab Results  Component Value Date   MICRALBCREAT 169.5 (H) 06/15/2007    No results found for: FRUCTOSAMINE   Lab Results  Component Value Date   CHOL 207 (H) 08/29/2012   HDL 49 08/29/2012   LDLCALC 119 (H) 08/29/2012   TRIG 193 (H) 08/29/2012   CHOLHDL 4.2 08/29/2012         ASSESSMENT / PLAN / RECOMMENDATIONS:   1) Type {NUMBERS 1 OR 2:522190} Diabetes Mellitus, ***controlled, With *** complications - Most recent A1c of *** %. Goal A1c < *** %.  ***  Plan: MEDICATIONS:  ***  EDUCATION / INSTRUCTIONS:  BG monitoring instructions: Patient is instructed to check his blood sugars *** times a day, ***.  Call [facility] if: BG persistently < 70 or > 300. . I reviewed the Rule of 15 for the treatment of hypoglycemia in detail with the patient. Literature supplied.  REFERRALS:  ***.   2) Diabetic complications:   Eye: Does *** have known diabetic retinopathy.   Neuro/ Feet: Does *** have known diabetic peripheral neuropathy .   Renal: Patient does *** have known baseline CKD. Last SCr on @LABRSLTLINEBRIEFNR (Creatinine)@. Last microalb/cr ***. He   is ***on an ACEI/ARB at present.   3) Lipids: Patient is *** on a statin.  4) Hypertension: *** at goal of < 140/90 mmHg.    F/U in ***    Signed electronically by: Mack Guise, MD  [facility] Endocrinology  [facility] [address] [place], [place] [address] Phone: [phone] FAX: [phone]   CC: Pa,  [facility] [place] [address] Phone: [phone]  Fax: [phone]  Return to Endocrinology clinic as below: Future Appointments  Date Time Provider [place]  09/03/2018  1:00 PM Makisha Marrin,  Melanie Crazier, MD [facility] None

## 2019-12-16 ENCOUNTER — Ambulatory Visit (HOSPITAL_COMMUNITY)
Admission: EM | Admit: 2019-12-16 | Discharge: 2019-12-16 | Disposition: A | Discharge status: Home / Self Care | Payer: Medicare Other | Attending: Family Medicine | Admitting: Family Medicine

## 2019-12-16 ENCOUNTER — Other Ambulatory Visit: Payer: Self-pay

## 2019-12-16 ENCOUNTER — Encounter (HOSPITAL_COMMUNITY): Payer: Self-pay

## 2019-12-16 ENCOUNTER — Telehealth (HOSPITAL_COMMUNITY): Payer: Self-pay | Admitting: Urgent Care

## 2019-12-16 DIAGNOSIS — R079 Chest pain, unspecified: Secondary | ICD-10-CM | POA: Insufficient documentation

## 2019-12-16 DIAGNOSIS — R5383 Other fatigue: Secondary | ICD-10-CM | POA: Insufficient documentation

## 2019-12-16 DIAGNOSIS — Z794 Long term (current) use of insulin: Secondary | ICD-10-CM | POA: Diagnosis present

## 2019-12-16 DIAGNOSIS — E119 Type 2 diabetes mellitus without complications: Secondary | ICD-10-CM | POA: Insufficient documentation

## 2019-12-16 DIAGNOSIS — R350 Frequency of micturition: Secondary | ICD-10-CM | POA: Insufficient documentation

## 2019-12-16 LAB — CBC
HCT: 41.7 % (ref 39.0–52.0)
Hemoglobin: 13.3 g/dL (ref 13.0–17.0)
MCH: 28.9 pg (ref 26.0–34.0)
MCHC: 31.9 g/dL (ref 30.0–36.0)
MCV: 90.5 fL (ref 80.0–100.0)
Platelets: 196 10*3/uL (ref 150–400)
RBC: 4.61 MIL/uL (ref 4.22–5.81)
RDW: 13.4 % (ref 11.5–15.5)
WBC: 4.9 10*3/uL (ref 4.0–10.5)
nRBC: 0 % (ref 0.0–0.2)

## 2019-12-16 LAB — COMPREHENSIVE METABOLIC PANEL
ALT: 24 U/L (ref 0–44)
AST: 19 U/L (ref 15–41)
Albumin: 3.6 g/dL (ref 3.5–5.0)
Alkaline Phosphatase: 66 U/L (ref 38–126)
Anion gap: 10 (ref 5–15)
BUN: 19 mg/dL (ref 8–23)
CO2: 26 mmol/L (ref 22–32)
Calcium: 9.9 mg/dL (ref 8.9–10.3)
Chloride: 104 mmol/L (ref 98–111)
Creatinine, Ser: 1.22 mg/dL (ref 0.61–1.24)
GFR calc Af Amer: >60 mL/min (ref >60)
GFR calc non Af Amer: >60 mL/min (ref >60)
Glucose, Bld: 233 mg/dL — ABNORMAL HIGH (ref 70–99)
Potassium: 4.7 mmol/L (ref 3.5–5.1)
Sodium: 140 mmol/L (ref 135–145)
Total Bilirubin: 0.7 mg/dL (ref 0.3–1.2)
Total Protein: 7.1 g/dL (ref 6.5–8.1)

## 2019-12-16 LAB — POCT URINALYSIS DIP (DEVICE)
Bilirubin Urine: NEGATIVE
Glucose, UA: 500 mg/dL — AB
Ketones, ur: NEGATIVE mg/dL
Nitrite: NEGATIVE
Protein, ur: >=300 mg/dL — AB
Specific Gravity, Urine: 1.025 (ref 1.005–1.030)
Urobilinogen, UA: 0.2 mg/dL (ref 0.0–1.0)
pH: 7 (ref 5.0–8.0)

## 2019-12-16 LAB — CBG MONITORING, ED: Glucose-Capillary: 211 mg/dL — ABNORMAL HIGH (ref 70–99)

## 2019-12-16 MED ORDER — INSULIN GLARGINE 100 UNIT/ML ~~LOC~~ SOLN: 25.0000 [IU] | Freq: Every day | SUBCUTANEOUS | 6 refills | Status: DC

## 2019-12-16 MED ORDER — LANTUS SOLOSTAR 100 UNIT/ML ~~LOC~~ SOPN: 25.0000 [IU] | PEN_INJECTOR | Freq: Every day | SUBCUTANEOUS | 11 refills | Status: AC

## 2019-12-16 NOTE — ED Triage Notes (Signed)
Pt is here feeling fatigue and states his CBG was 280 on Saturday, frequent urination all this started a week ago. Pt has not taken anything to relieve discomfort.

## 2019-12-16 NOTE — Discharge Instructions (Addendum)
Your EKG was not concerning today but recommend if chest pain continues you will need to go to the ER for further work-up.  Otherwise you can get referral from your primary for cardiology follow-up Your urine has some bacteria and blood.  We will send this for culture.  This could be the cause of some of your symptoms I have refilled your Lantus I am still waiting on your blood results and will call you with any abnormal results.

## 2019-12-16 NOTE — Telephone Encounter (Signed)
Pharmacy called and asked for clarification on Lantus prescription sent in today by NP Bast.  We will send in for Lantus pen instead of the vial.  Maintain the 25 units at bedtime.

## 2019-12-17 LAB — URINE CULTURE: Culture: NO GROWTH

## 2019-12-17 NOTE — ED Provider Notes (Signed)
[place]    CSN: [id number] Arrival date & time: 12/16/19  0254      History   Chief Complaint Chief Complaint  Patient presents with  . CBG high    HPI Charles [name] is a 64 y.o. male.   Patient is a 64 year old male past medical history of anemia, diabetes, renal transplant recipient, hypertension, high cholesterol, glaucoma.  He presents today with feeling fatigued and elevated blood sugars.  He has also has some urinary frequency.  Denies any increased thirst, dysuria, hematuria.  Reporting that his blood sugars have been running in the 200s and typically run in the low 100s for him.  He takes Engineer, agricultural at bedtime and NovoLog during the day.  Reporting he is not scheduled to see his primary care provider until July and needs refills on his insulin.  Blood pressure elevated here today.  He takes amlodipine for blood pressure.  No  headache, dizziness or blurred vision. He has had some mild chest discomfort that started this am.  Denies any associated shortness of breath, cough, chest congestion or fevers.   ROS per HPI      Past Medical History:  Diagnosis Date  . Anemia   . Diabetes mellitus without complication (HCC)    Type 2   . End stage kidney disease ([place])    HD   M-W-F  . Glaucoma   . High cholesterol   . Hypertension     Patient Active Problem List   Diagnosis Date Noted  . Renal transplant recipient 07/24/2018  . Acute cystitis with hematuria   . Complicated UTI (urinary tract infection) 10/17/2016  . Sepsis ([place]) 10/17/2016  . Chronic periodontitis 01/28/2015  . End stage renal disease ([place]) 10/03/2013  . ARF (acute renal failure) ([place]) 05/06/2013  . RENAL INSUFFICIENCY, ACUTE 08/19/2009  . APPENDECTOMY, HX OF 09/11/2007  . ERECTILE DYSFUNCTION 07/15/2007  . PROTEINURIA 07/15/2007  . ANEMIA 06/17/2007  . Diabetes ([place]) 05/20/2007  . HYPERLIPIDEMIA 05/20/2007  . Essential hypertension 05/20/2007    Past Surgical History:   Procedure Laterality Date  . APPENDECTOMY    . AV FISTULA PLACEMENT Right 05/19/2013   Procedure: RIGHT BRACHIOCEPHALIC  ARTERIOVENOUS (AV) FISTULA CREATION;  Surgeon: Conrad Avalon, MD;  Location: [facility];  Service: Vascular;  Laterality: Right;  . EYE SURGERY    . MULTIPLE EXTRACTIONS WITH ALVEOLOPLASTY N/A 01/28/2015   Procedure: Extraction of tooth #'s 2,7,06,23,76,28 with alveoloplasty and gross debridement of remaining teeth.;  Surgeon: Lenn Cal, DDS;  Location: [facility];  Service: Oral Surgery;  Laterality: N/A;  . PROSTATE BIOPSY         Home Medications    Prior to Admission medications   Medication Sig Start Date End Date Taking? Authorizing Provider  amLODipine (NORVASC) 10 MG tablet Take by mouth. 02/19/18  Yes [provider]  glucose blood test strip 1 each by Misc.(Non-Drug; Combo Route) route 4 times daily before meals and nightly. To obtain sample for glucose testing. One touch. 07/10/17  Yes [provider]  acetaminophen (TYLENOL) 500 MG tablet Take 1,000 mg by mouth every 6 (six) hours as needed for moderate pain.     [provider]  amLODipine (NORVASC) 10 MG tablet Take 10 mg by mouth daily. Take 1 tablet PO daily    [provider]  B Complex-C (B-COMPLEX WITH VITAMIN C) tablet Take 1 tablet by mouth daily. Take 0.8 mg PO daily    [provider]  b  complex-vitamin c-folic acid (NEPHRO-VITE) 0.8 MG TABS tablet Take by mouth.    [provider]  Blood Glucose Monitoring Suppl (ONETOUCH PING METER REMOTE) Supplies MISC by Does not apply route. Use as instructed to check blood sugar QID    [provider]  furosemide (LASIX) 80 MG tablet Take by mouth.    [provider]  glucose blood test strip 1 each by Other route 4 (four) times daily. Use as instructed QID before a meal and nightly to obtain glucose reading.    [provider]  hydrALAZINE (APRESOLINE) 50 MG tablet Take 50 mg by  mouth 2 (two) times daily. Take 2 tablet PO BID    [provider]  insulin aspart (NOVOLOG FLEXPEN) 100 UNIT/ML FlexPen Inject 8 Units into the skin 3 (three) times daily with meals. 07/23/18   Shamleffer, Melanie Crazier, MD  insulin glargine (LANTUS SOLOSTAR) 100 UNIT/ML Solostar Pen Inject 25 Units into the skin daily. 12/16/19   Jaynee Eagles, PA-C  Insulin Pen Needle (PEN NEEDLES 31GX5/16") 31G X 8 MM MISC by Does not apply route 4 (four) times daily. 4 times daily with meals and nightly. To administer insulin by pen    [provider]  metoprolol tartrate (LOPRESSOR) 100 MG tablet Take 100 mg by mouth 2 (two) times daily. Take 1 table PO BID    [provider]  multivitamin (RENA-VIT) TABS tablet Take 1 tablet by mouth daily.    [provider]  mycophenolate (MYFORTIC) 180 MG EC tablet Take 180 mg by mouth 2 (two) times daily. Take 3 table (540mg  total)  PO BID    [provider]  omeprazole (PRILOSEC) 20 MG capsule Take 20 mg by mouth daily. Take 1 capsule PO    [provider]  [facility] DELICA LANCETS 32R MISC by Does not apply route 4 (four) times daily. Check blood sugar QID before and meal and nightly    [provider]  pravastatin (PRAVACHOL) 20 MG tablet Take 20 mg by mouth daily. Take 1 tablet PO daily    [provider]  predniSONE (DELTASONE) 5 MG tablet Take 5 mg by mouth daily with breakfast. Take 1 tablet PO daily    [provider]  sodium bicarbonate 650 MG tablet Take 650 mg by mouth 2 (two) times daily. Take on table PO BID    [provider]  sulfamethoxazole-trimethoprim (BACTRIM,SEPTRA) 400-80 MG tablet Take 1 tablet by mouth 3 (three) times a week. Take 1 tablet PO every Monday, Wednesday, Friday    [provider]  tacrolimus (PROGRAF) 1 MG capsule Take 1 mg by mouth 2 (two) times daily. Take 1 capsule 1mg  PO BID. Take with 5mg  for total of 6 mg    [provider]   tacrolimus (PROGRAF) 5 MG capsule Take 5 mg by mouth 2 (two) times daily. Take 1 capsule 5 mg PO BID. Take with 1 mg tab for total of 6 mg    [provider]    Family History Family History  Problem Relation Age of Onset  . Diabetes Mother   . Colon cancer Neg Hx     Social History Social History   Tobacco Use  . Smoking status: Never Smoker  . Smokeless tobacco: Never Used  Substance Use Topics  . Alcohol use: No    Alcohol/week: 0.0 standard drinks  . Drug use: No     Allergies   Patient has no known allergies.   Review of Systems  Review of Systems   Physical Exam Triage Vital Signs ED Triage Vitals  Enc Vitals Group     BP 12/16/19 1006 (!) 180/77     Pulse Rate 12/16/19 1006 60     Resp 12/16/19 1006 19     Temp 12/16/19 1006 98.6 F (37 C)     Temp Source 12/16/19 1006 Oral     SpO2 12/16/19 1006 100 %     Weight 12/16/19 1003 180 lb (81.6 kg)     Height --      Head Circumference --      Peak Flow --      Pain Score 12/16/19 1003 0     Pain Loc --      Pain Edu? --      Excl. in [place]? --    No data found.  Updated Vital Signs BP (!) 180/77 (BP Location: Right Arm)   Pulse 60   Temp 98.6 F (37 C) (Oral)   Resp 19   Wt 180 lb (81.6 kg)   SpO2 100%   BMI 25.83 kg/m   Visual Acuity Right Eye Distance:   Left Eye Distance:   Bilateral Distance:    Right Eye Near:   Left Eye Near:    Bilateral Near:     Physical Exam Vitals and nursing note reviewed.  Constitutional:      Appearance: Normal appearance.  HENT:     Head: Normocephalic and atraumatic.     Nose: Nose normal.  Eyes:     Conjunctiva/sclera: Conjunctivae normal.  Cardiovascular:     Rate and Rhythm: Normal rate and regular rhythm.  Pulmonary:     Effort: Pulmonary effort is normal.     Breath sounds: Normal breath sounds.  Musculoskeletal:        General: Normal range of motion.     Cervical back: Normal range of motion.  Skin:    General: Skin is warm and  dry.  Neurological:     General: No focal deficit present.     Mental Status: He is alert.  Psychiatric:        Mood and Affect: Mood normal.      UC Treatments / Results  Labs (all labs ordered are listed, but only abnormal results are displayed) Labs Reviewed  COMPREHENSIVE METABOLIC PANEL - Abnormal; Notable for the following components:      Result Value   Glucose, Bld 233 (*)    All other components within normal limits  CBG MONITORING, ED - Abnormal; Notable for the following components:   Glucose-Capillary 211 (*)    All other components within normal limits  POCT URINALYSIS DIP (DEVICE) - Abnormal; Notable for the following components:   Glucose, UA 500 (*)    Hgb urine dipstick LARGE (*)    Protein, ur >=300 (*)    Leukocytes,Ua SMALL (*)    All other components within normal limits  URINE CULTURE  CBC    EKG   Radiology No results found.  Procedures Procedures (including critical care time)  Medications Ordered in UC Medications - No data to display  Initial Impression / Assessment and Plan / UC Course  I have reviewed the triage vital signs and the nursing notes.  Pertinent labs & imaging results that were available during my care of the patient were reviewed by me and considered in my medical decision making (see chart for details).     Chest pain EKG not concerning today.  Reviewed EKG with  Dr. Mannie Stabile Sinus bradycardia with left ventricular hypertrophy and repolarization. No concern for acute coronary syndrome at this time. We will have him monitor his chest pain and if his symptoms worsen he will need to go to the ER for further work-up. Otherwise he can follow-up with cardiology  Urinary frequency and fatigue His urine did have small leuks and large hemoglobin. There is some concern for urinary tract infection.  Sent for culture.  This could be causing his symptoms and elevated blood sugar. Will treat with antibiotics based on culture results   Diabetes Refill Lantus as requested Patient's blood sugar was 233 He has been taking his insulin as prescribed.  Patient has plans to see primary care in July All results reviewed with patient over the phone Final Clinical Impressions(s) / UC Diagnoses   Final diagnoses:  Chest pain, unspecified type  Urinary frequency  Fatigue, unspecified type  Type 2 diabetes mellitus without complication, with long-term current use of insulin [facility])     Discharge Instructions     Your EKG was not concerning today but recommend if chest pain continues you will need to go to the ER for further work-up.  Otherwise you can get referral from your primary for cardiology follow-up Your urine has some bacteria and blood.  We will send this for culture.  This could be the cause of some of your symptoms I have refilled your Lantus I am still waiting on your blood results and will call you with any abnormal results.     ED Prescriptions    Medication Sig Dispense Auth. Provider   insulin glargine (LANTUS) 100 UNIT/ML injection Inject 0.25 mLs (25 Units total) into the skin at bedtime. 15 mL Bast, Traci A, NP     PDMP not reviewed this encounter.   Orvan July, NP 12/17/19 [phone]

## 2019-12-30 ENCOUNTER — Encounter: Payer: Self-pay | Admitting: Internal Medicine

## 2019-12-30 DIAGNOSIS — B1910 Unspecified viral hepatitis B without hepatic coma: Secondary | ICD-10-CM | POA: Insufficient documentation

## 2020-01-23 ENCOUNTER — Other Ambulatory Visit: Payer: Self-pay | Admitting: Urology

## 2020-01-28 ENCOUNTER — Other Ambulatory Visit: Payer: Self-pay | Admitting: Urology

## 2020-01-28 DIAGNOSIS — R3121 Asymptomatic microscopic hematuria: Secondary | ICD-10-CM

## 2020-02-05 ENCOUNTER — Ambulatory Visit
Admission: RE | Admit: 2020-02-05 | Discharge: 2020-02-05 | Disposition: A | Discharge status: Home / Self Care | Payer: Medicare Other | Source: Ambulatory Visit | Attending: Urology | Admitting: Urology

## 2020-02-05 DIAGNOSIS — R3121 Asymptomatic microscopic hematuria: Secondary | ICD-10-CM

## 2020-02-05 NOTE — H&P (Signed)
CC/HPI: cc: microscopic hematuria   01/20/20: Cystoscopy   01/13/20: 64 year old man with a history of a renal transplant 2018 referred for microscopic hematuria. Patient denies gross hematuria or UTIs. He has his native kidneys and was given to kidneys during his transplant.     ALLERGIES: None   MEDICATIONS: None   GU PSH: None   NON-GU PSH: Glaucoma Surgery, Bilateral - 2015 Transplant Kidney, Bilateral - 2018     GU PMH: Microscopic hematuria, I discussed microscopic hematuria with the patient and the recommended workup including cystoscopy and upper tract imaging. I need to obtain further information on patient's renal function which will help determine modality of upper tract imaging. - 01/13/2020    NON-GU PMH: None   FAMILY HISTORY: 2 daughters - Daughter 4 sons - Son father deceased - Runs in Family mother deceased - Mother   SOCIAL HISTORY: Marital Status: Married Ethnicity: Not Hispanic Or Latino; Race: Black or African American Current Smoking Status: Patient has never smoked.   Tobacco Use Assessment Completed: Used Tobacco in last 30 days? Drinks 2 drinks per month. Types of alcohol consumed: Wine.  Does not drink caffeine. 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    REVIEW OF SYSTEMS:    GU Review Male:   Patient denies frequent urination, hard to postpone urination, burning /pain with urination, get up at night to urinate, leakage of urine, stream starts and stops, trouble starting your stream, have to strain to urinate, and being pregnant.  Gastrointestinal (Upper):   Patient denies nausea, vomiting, and indigestion/ heartburn.  Gastrointestinal (Lower):   Patient denies diarrhea and constipation.  Constitutional:   Patient denies fever, night sweats, weight loss, and fatigue.  Skin:   Patient denies skin rash/ lesion and itching.  Eyes:   Patient denies double vision and blurred vision.  Ears/ Nose/ Throat:   Patient denies sore throat and sinus problems.   Hematologic/Lymphatic:   Patient denies swollen glands and easy bruising.  Cardiovascular:   Patient denies leg swelling and chest pains.  Respiratory:   Patient denies cough and shortness of breath.  Endocrine:   Patient denies excessive thirst.  Musculoskeletal:   Patient denies back pain and joint pain.  Neurological:   Patient denies headaches and dizziness.  Psychologic:   Patient denies depression and anxiety.   VITAL SIGNS: None   GU PHYSICAL EXAMINATION:    Urethral Meatus: Normal size. No lesion, no wart, no discharge, no polyp. Normal location.  Penis: Circumcised, no warts, no cracks. No dorsal Peyronie's plaques, no left corporal Peyronie's plaques, no right corporal Peyronie's plaques, no scarring, no warts. No balanitis, no meatal stenosis.   MULTI-SYSTEM PHYSICAL EXAMINATION:    Constitutional: Well-nourished. No physical deformities. Normally developed. Good grooming.  Neck: Neck symmetrical, not swollen. Normal tracheal position.  Respiratory: No labored breathing, no use of accessory muscles.   Cardiovascular: Normal temperature  Skin: No paleness, no jaundice, no cyanosis. No lesion, no ulcer, no rash.  Neurologic / Psychiatric: Oriented to time, oriented to place, oriented to person. No depression, no anxiety, no agitation.  Gastrointestinal: No rigidity, non obese abdomen.   Eyes: Normal conjunctivae. Normal eyelids.  Ears, Nose, Mouth, and Throat: Left ear no scars, no lesions, no masses. Right ear no scars, no lesions, no masses. Nose no scars, no lesions, no masses. Normal hearing. Normal lips.  Musculoskeletal: Normal gait and station of head and neck.     Complexity of Data:  Urine Test Review:   Urinalysis   PROCEDURES:  Flexible Cystoscopy - 52000  Risks, benefits, and some of the potential complications of the procedure were discussed at length with the patient including infection, bleeding, voiding discomfort, urinary retention, fever, chills,  sepsis, and others. All questions were answered. Informed consent was obtained. Sterile technique and intraurethral analgesia were used.  Meatus:  Normal size. Normal location. Normal condition.  Urethra:  Mild bulbous stricture preventing scope from traversing urethra  Meatus:  Normal size. Normal location. Normal condition.  Urethra:  No hypermobility. No leakage.  Ureteral Orifices:  Normal location. Normal size. Normal shape. Effluxed clear urine.  Bladder:  No trabeculation. No tumors. Normal mucosa. No stones.      The bulbar urethral stricture prevented the scope from traversing the urethra into the bladder. The procedure was well-tolerated and without complications. Instructions were given to call the office immediately for bloody urine, difficulty urinating, urinary retention, painful or frequent urination, fever, chills, nausea, vomiting or other illness. The patient stated that he understood these instructions and would comply with them.         Urinalysis w/Scope Dipstick Dipstick Cont'd Micro  Color: Yellow Bilirubin: Neg mg/dL WBC/hpf: 20 - 40/hpf  Appearance: Slightly Cloudy Ketones: Neg mg/dL RBC/hpf: 20 - 40/hpf  Specific Gravity: 1.020 Blood: 2+ ery/uL Bacteria: Few (10-25/hpf)  pH: 6.0 Protein: 3+ mg/dL Cystals: NS (Not Seen)  Glucose: Trace mg/dL Urobilinogen: 0.2 mg/dL Casts: NS (Not Seen)    Nitrites: Neg Trichomonas: Not Present    Leukocyte Esterase: 3+ leu/uL Mucous: Not Present      Epithelial Cells: NS (Not Seen)      Yeast: NS (Not Seen)      Sperm: Not Present    ASSESSMENT:      ICD-10 Details  1 GU:   Microscopic hematuria - R31.21 Undiagnosed New Problem - Evidence of Fallot urethral stricture on cystoscopy today. Recommend DVIU in the operating room as well as cystoscopy to further evaluate lower urinary tract. We discussed the risks and benefits of the procedure as well as need for Foley postoperatively. Patient understands and wishes to proceed. Patient  also to be scheduled for formal renal ultrasound of bilateral native kidneys and bilateral transplant kidneys.  2   Bulbar urethral stricture - N35.011 Undiagnosed New Problem - Patient had urethral stricture seen on cystoscopy today. Risks, benefits and alternatives to DVIU were discussed with the patient.   PLAN:           Orders Labs CULTURE, URINE          Document Letter(s):  Created for Patient: Clinical Summary         Notes:   Risks and benefits of DVIU with patient including but not limited to bleeding, infection, pain, recurrence, damage to surrounding structures, need for Foley catheter, need for future procedures/treatments.

## 2020-02-06 NOTE — Patient Instructions (Addendum)
DUE TO COVID-19 ONLY ONE VISITOR IS ALLOWED TO COME WITH YOU AND STAY IN THE WAITING ROOM ONLY DURING PRE OP AND PROCEDURE DAY OF SURGERY. THE 1 VISITOR MAY VISIT WITH YOU AFTER SURGERY IN YOUR PRIVATE ROOM DURING VISITING HOURS ONLY!  YOU NEED TO HAVE A COVID 19 TEST ON: 02/13/20 @ 10:30 am , THIS TEST MUST BE DONE BEFORE SURGERY, COME  [address], [address] [place] , [address].  ([place]) ONCE YOUR COVID TEST IS COMPLETED, PLEASE BEGIN THE QUARANTINE INSTRUCTIONS AS OUTLINED IN YOUR HANDOUT.                Charles [name]   Your procedure is scheduled on: 02/17/20   Report to [facility] Main  Entrance   Report to short stay at: 5:30 AM     Call this number if you have problems the morning of surgery [phone]    Remember: Do not eat food or drink liquids :After Midnight.   BRUSH YOUR TEETH MORNING OF SURGERY AND RINSE YOUR MOUTH OUT, NO CHEWING GUM CANDY OR MINTS.     Take these medicines the morning of surgery with A SIP OF WATER: amlodipine,hydrazaline,metoprolol,mycophenolate,prednisone,tacrolimus.  How to Manage Your Diabetes Before and After Surgery  Why is it important to control my blood sugar before and after surgery? . Improving blood sugar levels before and after surgery helps healing and can limit problems. . A way of improving blood sugar control is eating a healthy diet by: o  Eating less sugar and carbohydrates o  Increasing activity/exercise o  Talking with your doctor about reaching your blood sugar goals . High blood sugars (greater than 180 mg/dL) can raise your risk of infections and slow your recovery, so you will need to focus on controlling your diabetes during the weeks before surgery. . Make sure that the doctor who takes care of your diabetes knows about your planned surgery including the date and location.  How do I manage my blood sugar before surgery? . Check your blood sugar at least 4 times a day, starting 2 days  before surgery, to make sure that the level is not too high or low. o Check your blood sugar the morning of your surgery when you wake up and every 2 hours until you get to the Short Stay unit. . If your blood sugar is less than 70 mg/dL, you will need to treat for low blood sugar: o Do not take insulin. o Treat a low blood sugar (less than 70 mg/dL) with  cup of clear juice (cranberry or apple), 4 glucose tablets, OR glucose gel. o Recheck blood sugar in 15 minutes after treatment (to make sure it is greater than 70 mg/dL). If your blood sugar is not greater than 70 mg/dL on recheck, call [phone] for further instructions. . Report your blood sugar to the short stay nurse when you get to Short Stay.  . If you are admitted to the hospital after surgery: o Your blood sugar will be checked by the staff and you will probably be given insulin after surgery (instead of oral diabetes medicines) to make sure you have good blood sugar levels. o The goal for blood sugar control after surgery is 80-180 mg/dL.   WHAT DO I DO ABOUT MY DIABETES MEDICATION?  Marland Kitchen Do not take oral diabetes medicines (pills) the morning of surgery.  . THE DAY BEFORE SURGERY, take The usual dose of Novolog,except the bed time dose.  Take the morning dose of lantus,and half of the evening dose.   . THE MORNING OF SURGERY, take   units of         insulin.  . The day of surgery:  . If your CBG is greater than 220 mg/dL, you may take  of your sliding scale  . (correction) dose of insulin.          Take half of tha lantus dose.                                You may not have any metal on your body including hair pins and              piercings  Do not wear jewelry, lotions, powders or perfumes, deodorant             Men may shave face and neck.   Do not bring valuables to the hospital. [place].  Contacts, dentures or bridgework may not be worn into  surgery.  Leave suitcase in the car. After surgery it may be brought to your room.     Patients discharged the day of surgery will not be allowed to drive home. IF YOU ARE HAVING SURGERY AND GOING HOME THE SAME DAY, YOU MUST HAVE AN ADULT TO DRIVE YOU HOME AND BE WITH YOU FOR 24 HOURS. YOU MAY GO HOME BY TAXI OR UBER OR ORTHERWISE, BUT AN ADULT MUST ACCOMPANY YOU HOME AND STAY WITH YOU FOR 24 HOURS.  Name and phone number of your driver:  Special Instructions: N/A              Please read over the following fact sheets you were given: _____________________________________________________________________             [facility] - Preparing for Surgery Before surgery, you can play an important role.  Because skin is not sterile, your skin needs to be as free of germs as possible.  You can reduce the number of germs on your skin by washing with CHG (chlorahexidine gluconate) soap before surgery.  CHG is an antiseptic cleaner which kills germs and bonds with the skin to continue killing germs even after washing. Please DO NOT use if you have an allergy to CHG or antibacterial soaps.  If your skin becomes reddened/irritated stop using the CHG and inform your nurse when you arrive at Short Stay. Do not shave (including legs and underarms) for at least 48 hours prior to the first CHG shower.  You may shave your face/neck. Please follow these instructions carefully:  1.  Shower with CHG Soap the night before surgery and the  morning of Surgery.  2.  If you choose to wash your hair, wash your hair first as usual with your  normal  shampoo.  3.  After you shampoo, rinse your hair and body thoroughly to remove the  shampoo.                           4.  Use CHG as you would any other liquid soap.  You can apply chg directly  to the skin and wash                       Gently with  a scrungie or clean washcloth.  5.  Apply the CHG Soap to your body ONLY FROM THE NECK DOWN.   Do not use on face/ open                            Wound or open sores. Avoid contact with eyes, ears mouth and genitals (private parts).                       Wash face,  Genitals (private parts) with your normal soap.             6.  Wash thoroughly, paying special attention to the area where your surgery  will be performed.  7.  Thoroughly rinse your body with warm water from the neck down.  8.  DO NOT shower/wash with your normal soap after using and rinsing off  the CHG Soap.                9.  Pat yourself dry with a clean towel.            10.  Wear clean pajamas.            11.  Place clean sheets on your bed the night of your first shower and do not  sleep with pets. Day of Surgery : Do not apply any lotions/deodorants the morning of surgery.  Please wear clean clothes to the hospital/surgery center.  FAILURE TO FOLLOW THESE INSTRUCTIONS MAY RESULT IN THE CANCELLATION OF YOUR SURGERY PATIENT SIGNATURE_________________________________  NURSE SIGNATURE__________________________________  ________________________________________________________________________

## 2020-02-09 ENCOUNTER — Encounter (HOSPITAL_COMMUNITY)
Admission: RE | Admit: 2020-02-09 | Discharge: 2020-02-09 | Disposition: A | Discharge status: Home / Self Care | Payer: Medicare Other | Source: Ambulatory Visit | Attending: Urology | Admitting: Urology

## 2020-02-09 ENCOUNTER — Other Ambulatory Visit: Payer: Self-pay

## 2020-02-09 ENCOUNTER — Encounter (HOSPITAL_COMMUNITY): Payer: Self-pay

## 2020-02-09 DIAGNOSIS — Z01812 Encounter for preprocedural laboratory examination: Secondary | ICD-10-CM | POA: Insufficient documentation

## 2020-02-09 LAB — CBC
HCT: 43.1 % (ref 39.0–52.0)
Hemoglobin: 13.4 g/dL (ref 13.0–17.0)
MCH: 28.3 pg (ref 26.0–34.0)
MCHC: 31.1 g/dL (ref 30.0–36.0)
MCV: 90.9 fL (ref 80.0–100.0)
Platelets: 202 10*3/uL (ref 150–400)
RBC: 4.74 MIL/uL (ref 4.22–5.81)
RDW: 13.8 % (ref 11.5–15.5)
WBC: 5.5 10*3/uL (ref 4.0–10.5)
nRBC: 0 % (ref 0.0–0.2)

## 2020-02-09 LAB — BASIC METABOLIC PANEL
Anion gap: 9 (ref 5–15)
BUN: 27 mg/dL — ABNORMAL HIGH (ref 8–23)
CO2: 27 mmol/L (ref 22–32)
Calcium: 10 mg/dL (ref 8.9–10.3)
Chloride: 106 mmol/L (ref 98–111)
Creatinine, Ser: 1.27 mg/dL — ABNORMAL HIGH (ref 0.61–1.24)
GFR calc Af Amer: >60 mL/min (ref >60)
GFR calc non Af Amer: >60 mL/min (ref >60)
Glucose, Bld: 143 mg/dL — ABNORMAL HIGH (ref 70–99)
Potassium: 3.9 mmol/L (ref 3.5–5.1)
Sodium: 142 mmol/L (ref 135–145)

## 2020-02-09 LAB — HEMOGLOBIN A1C
Hgb A1c MFr Bld: 10.1 % — ABNORMAL HIGH (ref 4.8–5.6)
Mean Plasma Glucose: 243.17 mg/dL

## 2020-02-09 NOTE — Progress Notes (Signed)
COVID Vaccine Completed:yes Date COVID Vaccine completed:11/06/19 COVID vaccine manufacturer: *[place]   PCP - [facility] Cardiologist -   Chest x-ray -  EKG - 12/16/19. : EPIC Stress Test -  ECHO -  Cardiac Cath -   Sleep Study -  CPAP -   Fasting Blood Sugar - 116-120 Checks Blood Sugar _2__ times a day  Blood Thinner Instructions: Aspirin Instructions: Last Dose:  Anesthesia review: Hx: kidney transplant,HTN,DIA  Patient denies shortness of breath, fever, cough and chest pain at PAT appointment   Patient verbalized understanding of instructions that were given to them at the PAT appointment. Patient was also instructed that they will need to review over the PAT instructions again at home before surgery.

## 2020-02-10 LAB — GLUCOSE, CAPILLARY: Glucose-Capillary: 158 mg/dL — ABNORMAL HIGH (ref 70–99)

## 2020-02-10 NOTE — Progress Notes (Signed)
Lab results: A1C: 10.1

## 2020-02-13 ENCOUNTER — Other Ambulatory Visit (HOSPITAL_COMMUNITY)
Admission: RE | Admit: 2020-02-13 | Discharge: 2020-02-13 | Disposition: A | Discharge status: Home / Self Care | Payer: Medicare Other | Source: Ambulatory Visit | Attending: Urology | Admitting: Urology

## 2020-02-13 DIAGNOSIS — Z20822 Contact with and (suspected) exposure to covid-19: Secondary | ICD-10-CM | POA: Insufficient documentation

## 2020-02-13 DIAGNOSIS — Z01812 Encounter for preprocedural laboratory examination: Secondary | ICD-10-CM | POA: Insufficient documentation

## 2020-02-14 LAB — SARS CORONAVIRUS 2 (TAT 6-24 HRS): SARS Coronavirus 2: NEGATIVE

## 2020-02-16 NOTE — Anesthesia Preprocedure Evaluation (Addendum)
Anesthesia Evaluation  Patient identified by MRN, date of birth, ID band Patient awake    Reviewed: Allergy & Precautions, NPO status , Patient's Chart, lab work & pertinent test results  Airway Mallampati: II  TM Distance: >3 FB Neck ROM: Full    Dental no notable dental hx. (+) Teeth Intact   Pulmonary neg pulmonary ROS,    Pulmonary exam normal breath sounds clear to auscultation       Cardiovascular hypertension, Pt. on medications and Pt. on home beta blockers Normal cardiovascular exam Rhythm:Regular Rate:Normal     Neuro/Psych negative neurological ROS  negative psych ROS   GI/Hepatic negative GI ROS, Neg liver ROS,   Endo/Other  diabetes, Insulin Dependent  Renal/GU Renal diseaseS/P renal transplant 2018     Musculoskeletal negative musculoskeletal ROS (+)   Abdominal   Peds  Hematology   Anesthesia Other Findings   Reproductive/Obstetrics                            Anesthesia Physical Anesthesia Plan  ASA: III  Anesthesia Plan: General   Post-op Pain Management:    Induction: Intravenous  PONV Risk Score and Plan: 3 and Treatment may vary due to age or medical condition, Ondansetron, Dexamethasone and Midazolam  Airway Management Planned: LMA  Additional Equipment: None  Intra-op Plan:   Post-operative Plan:   Informed Consent: I have reviewed the patients History and Physical, chart, labs and discussed the procedure including the risks, benefits and alternatives for the proposed anesthesia with the patient or authorized representative who has indicated his/her understanding and acceptance.     Dental advisory given  Plan Discussed with: CRNA  Anesthesia Plan Comments:        Anesthesia Quick Evaluation

## 2020-02-17 ENCOUNTER — Encounter (HOSPITAL_COMMUNITY)
Admission: RE | Disposition: A | Discharge status: Home / Self Care | Payer: Self-pay | Source: Ambulatory Visit | Attending: Urology

## 2020-02-17 ENCOUNTER — Ambulatory Visit (HOSPITAL_COMMUNITY)
Admission: RE | Admit: 2020-02-17 | Discharge: 2020-02-17 | Disposition: A | Discharge status: Home / Self Care | Payer: Medicare Other | Source: Ambulatory Visit | Attending: Urology | Admitting: Urology

## 2020-02-17 ENCOUNTER — Other Ambulatory Visit: Payer: Self-pay

## 2020-02-17 ENCOUNTER — Encounter (HOSPITAL_COMMUNITY): Payer: Self-pay | Admitting: Urology

## 2020-02-17 ENCOUNTER — Ambulatory Visit (HOSPITAL_COMMUNITY): Payer: Medicare Other | Admitting: Physician Assistant

## 2020-02-17 ENCOUNTER — Ambulatory Visit (HOSPITAL_COMMUNITY): Payer: Medicare Other | Admitting: Certified Registered Nurse Anesthetist

## 2020-02-17 DIAGNOSIS — I10 Essential (primary) hypertension: Secondary | ICD-10-CM | POA: Insufficient documentation

## 2020-02-17 DIAGNOSIS — Z960 Presence of urogenital implants: Secondary | ICD-10-CM | POA: Insufficient documentation

## 2020-02-17 DIAGNOSIS — Z940 Kidney transplant status: Secondary | ICD-10-CM | POA: Insufficient documentation

## 2020-02-17 DIAGNOSIS — N35912 Unspecified bulbous urethral stricture, male: Secondary | ICD-10-CM | POA: Insufficient documentation

## 2020-02-17 DIAGNOSIS — R3129 Other microscopic hematuria: Secondary | ICD-10-CM | POA: Diagnosis not present

## 2020-02-17 HISTORY — PX: CYSTOSCOPY WITH DIRECT VISION INTERNAL URETHROTOMY: SHX6637

## 2020-02-17 LAB — GLUCOSE, CAPILLARY
Glucose-Capillary: 157 mg/dL — ABNORMAL HIGH (ref 70–99)
Glucose-Capillary: 180 mg/dL — ABNORMAL HIGH (ref 70–99)

## 2020-02-17 SURGERY — CYSTOSCOPY, WITH DIRECT VISION INTERNAL URETHROTOMY
Anesthesia: General

## 2020-02-17 MED ORDER — CEFAZOLIN SODIUM-DEXTROSE 2-4 GM/100ML-% IV SOLN
2.0000 g | Freq: Once | INTRAVENOUS | Status: AC
  Administered 2020-02-17: 2 g via INTRAVENOUS

## 2020-02-17 MED ORDER — LACTATED RINGERS IV SOLN: INTRAVENOUS | Status: DC

## 2020-02-17 MED ORDER — ACETAMINOPHEN 10 MG/ML IV SOLN: 1000.0000 mg | Freq: Once | INTRAVENOUS | Status: DC | PRN

## 2020-02-17 MED ORDER — STERILE WATER FOR IRRIGATION IR SOLN
Status: DC | PRN
  Administered 2020-02-17: 3000 mL

## 2020-02-17 MED ORDER — MIDAZOLAM HCL 2 MG/2ML IJ SOLN
INTRAMUSCULAR | Status: AC
  Filled 2020-02-17: qty 2

## 2020-02-17 MED ORDER — CEFAZOLIN SODIUM-DEXTROSE 2-4 GM/100ML-% IV SOLN
INTRAVENOUS | Status: AC
  Filled 2020-02-17: qty 100

## 2020-02-17 MED ORDER — PROPOFOL 10 MG/ML IV BOLUS
INTRAVENOUS | Status: AC
  Filled 2020-02-17: qty 40

## 2020-02-17 MED ORDER — FENTANYL CITRATE (PF) 100 MCG/2ML IJ SOLN
INTRAMUSCULAR | Status: AC
  Filled 2020-02-17: qty 2

## 2020-02-17 MED ORDER — EPHEDRINE 5 MG/ML INJ
INTRAVENOUS | Status: AC
  Filled 2020-02-17: qty 10

## 2020-02-17 MED ORDER — OXYCODONE HCL 5 MG PO TABS
ORAL_TABLET | ORAL | Status: AC
  Filled 2020-02-17: qty 1

## 2020-02-17 MED ORDER — ORAL CARE MOUTH RINSE: 15.0000 mL | Freq: Once | OROMUCOSAL | Status: AC

## 2020-02-17 MED ORDER — AMISULPRIDE (ANTIEMETIC) 5 MG/2ML IV SOLN: 10.0000 mg | Freq: Once | INTRAVENOUS | Status: DC | PRN

## 2020-02-17 MED ORDER — FENTANYL CITRATE (PF) 100 MCG/2ML IJ SOLN
INTRAMUSCULAR | Status: DC | PRN
  Administered 2020-02-17: 50 ug via INTRAVENOUS

## 2020-02-17 MED ORDER — OXYCODONE HCL 5 MG PO TABS
5.0000 mg | ORAL_TABLET | Freq: Once | ORAL | Status: AC | PRN
  Administered 2020-02-17: 5 mg via ORAL

## 2020-02-17 MED ORDER — OXYCODONE HCL 5 MG/5ML PO SOLN: 5.0000 mg | Freq: Once | ORAL | Status: AC | PRN

## 2020-02-17 MED ORDER — DEXAMETHASONE SODIUM PHOSPHATE 10 MG/ML IJ SOLN
INTRAMUSCULAR | Status: AC
  Filled 2020-02-17: qty 1

## 2020-02-17 MED ORDER — CHLORHEXIDINE GLUCONATE 0.12 % MT SOLN
15.0000 mL | Freq: Once | OROMUCOSAL | Status: AC
  Administered 2020-02-17: 15 mL via OROMUCOSAL

## 2020-02-17 MED ORDER — CEPHALEXIN 250 MG PO CAPS: 250.0000 mg | ORAL_CAPSULE | Freq: Two times a day (BID) | ORAL | 0 refills | Status: AC

## 2020-02-17 MED ORDER — LIDOCAINE 2% (20 MG/ML) 5 ML SYRINGE
INTRAMUSCULAR | Status: DC | PRN
  Administered 2020-02-17: 100 mg via INTRAVENOUS

## 2020-02-17 MED ORDER — HYDROMORPHONE HCL 1 MG/ML IJ SOLN: 0.2500 mg | INTRAMUSCULAR | Status: DC | PRN

## 2020-02-17 MED ORDER — ONDANSETRON HCL 4 MG/2ML IJ SOLN
INTRAMUSCULAR | Status: DC | PRN
  Administered 2020-02-17: 4 mg via INTRAVENOUS

## 2020-02-17 MED ORDER — MEPERIDINE HCL 50 MG/ML IJ SOLN: 6.2500 mg | INTRAMUSCULAR | Status: DC | PRN

## 2020-02-17 MED ORDER — DEXAMETHASONE SODIUM PHOSPHATE 10 MG/ML IJ SOLN
INTRAMUSCULAR | Status: DC | PRN
Start: 2020-02-17 — End: 2020-02-17
  Administered 2020-02-17: 5 mg via INTRAVENOUS

## 2020-02-17 MED ORDER — ONDANSETRON HCL 4 MG/2ML IJ SOLN
INTRAMUSCULAR | Status: AC
  Filled 2020-02-17: qty 2

## 2020-02-17 MED ORDER — PROPOFOL 10 MG/ML IV BOLUS
INTRAVENOUS | Status: DC | PRN
  Administered 2020-02-17: 140 mg via INTRAVENOUS

## 2020-02-17 MED ORDER — MIDAZOLAM HCL 5 MG/5ML IJ SOLN
INTRAMUSCULAR | Status: DC | PRN
  Administered 2020-02-17: 2 mg via INTRAVENOUS

## 2020-02-17 MED ORDER — ONDANSETRON HCL 4 MG/2ML IJ SOLN: 4.0000 mg | Freq: Once | INTRAMUSCULAR | Status: DC | PRN

## 2020-02-17 SURGICAL SUPPLY — 18 items
BAG URINE DRAIN 2000ML AR STRL (UROLOGICAL SUPPLIES) ×3 IMPLANT
BAG URINE LEG 500ML (DRAIN) IMPLANT
BAG URO CATCHER STRL LF (MISCELLANEOUS) ×3 IMPLANT
CATH FOLEY 2W COUNCIL 5CC 18FR (CATHETERS) ×3 IMPLANT
CATH FOLEY 2WAY SLVR 30CC 20FR (CATHETERS) IMPLANT
CATH FOLEY 3WAY 30CC 22FR (CATHETERS) IMPLANT
CLOTH BEACON ORANGE TIMEOUT ST (SAFETY) ×3 IMPLANT
GLOVE BIO SURGEON STRL SZ7.5 (GLOVE) IMPLANT
GOWN STRL REUS W/TWL XL LVL3 (GOWN DISPOSABLE) ×3 IMPLANT
GUIDEWIRE STR DUAL SENSOR (WIRE) ×3 IMPLANT
KIT TURNOVER CYSTO (KITS) ×3 IMPLANT
MANIFOLD NEPTUNE II (INSTRUMENTS) ×3 IMPLANT
PACK CYSTO (CUSTOM PROCEDURE TRAY) ×3 IMPLANT
PLUG CATH AND CAP STER (CATHETERS) IMPLANT
SYR 30ML LL (SYRINGE) IMPLANT
TUBING CONNECTING 10 (TUBING) ×2 IMPLANT
TUBING CONNECTING 10' (TUBING) ×1
TUBING UROLOGY SET (TUBING) IMPLANT

## 2020-02-17 NOTE — Anesthesia Postprocedure Evaluation (Signed)
Anesthesia Post Note  Patient: Charles [name]  Procedure(s) Performed: CYSTOSCOPY WITH DIRECT VISION INTERNAL URETHROTOMY (N/A )     Patient location during evaluation: PACU Anesthesia Type: General Level of consciousness: awake and alert Pain management: pain level controlled Vital Signs Assessment: post-procedure vital signs reviewed and stable Respiratory status: spontaneous breathing, nonlabored ventilation, respiratory function stable and patient connected to nasal cannula oxygen Cardiovascular status: blood pressure returned to baseline and stable Postop Assessment: no apparent nausea or vomiting Anesthetic complications: no   No complications documented.  Last Vitals:  Vitals:   02/17/20 0830 02/17/20 0845  BP: (!) 163/75 (!) 156/79  Pulse: 61 61  Resp: 14 14  Temp: 36.6 C 36.6 C  SpO2: 96% 100%    Last Pain:  Vitals:   02/17/20 0905  TempSrc:   PainSc: 4                  Barnet Glasgow

## 2020-02-17 NOTE — Transfer of Care (Signed)
Immediate Anesthesia Transfer of Care Note  Patient: Charles [name]  Procedure(s) Performed: CYSTOSCOPY WITH DIRECT VISION INTERNAL URETHROTOMY (N/A )  Patient Location: PACU  Anesthesia Type:General  Level of Consciousness: awake and patient cooperative  Airway & Oxygen Therapy: Patient Spontanous Breathing and Patient connected to face mask oxygen  Post-op Assessment: Report given to RN, Post -op Vital signs reviewed and stable and Patient moving all extremities  Post vital signs: Reviewed and stable  Last Vitals:  Vitals Value Taken Time  BP 171/84 02/17/20 0804  Temp    Pulse 63 02/17/20 0807  Resp 14 02/17/20 0807  SpO2 100 % 02/17/20 0807  Vitals shown include unvalidated device data.  Last Pain:  Vitals:   02/17/20 0635  TempSrc: Oral  PainSc:       Patients Stated Pain Goal: 4 (39/76/73 4193)  Complications: No complications documented.

## 2020-02-17 NOTE — Op Note (Signed)
Preoperative diagnosis:  1. Urethral stricture  2. microscopic hematuria  Postoperative diagnosis:  1. Urethral stricture 2. Microscopic hematuria 3. Retained ureteral stent and transplant ureter  Procedure: 1. Cystoscopy, direct visual internal urethrotomy, Foley catheter placement  Surgeon: Jacalyn Lefevre, MD  Anesthesia: General  Complications: None  Intraoperative findings:  1.  Anterior urethral stricture -2 separate strictures seen approximately 1 cm apart 2.  After strictures were cut cystoscopy took place and an existing stent into his transplant kidney on the right upper quadrant of the bladder was seen significantly calcified 3.  No bladder tumors or stones, edema of the trigone consistent with stent  EBL: Minimal  Specimens: None  Indication: Charles [name] is a 64 y.o. patient with with a history of renal failure status post kidney transplant referred for microscopic hematuria.  Cystoscopy in the office revealed a stricture of his anterior urethra and unable to pass cystoscope in the bladder.  After reviewing the management options for treatment, he elected to proceed with the above surgical procedure(s). We have discussed the potential benefits and risks of the procedure, side effects of the proposed treatment, the likelihood of the patient achieving the goals of the procedure, and any potential problems that might occur during the procedure or recuperation. Informed consent has been obtained.  Description of procedure:  The patient was taken to the operating room and general anesthesia was induced.  The patient was placed in the dorsal lithotomy position, prepped and draped in the usual sterile fashion, and preoperative antibiotics were administered. A preoperative time-out was performed.   69 French rigid cystoscope was placed in the urethral meatus and advanced to the area of the stricture.  There were 2 separate areas of scar in the anterior urethra approximately  1 cm apart.  A wire was then placed under direct visualization into the bladder past the scar.  The wire was secured and cystoscope was removed.  Next the DVIU scope was used to incise the scar tissue at 12:00, 5:00 and 7:00.  The scope was then able to freely pass into the bladder.  Cystoscopy took place which revealed edema of the trigone with an existing retained/calcified ureteral stent emanating from the ureteroneocystostomy orifice of the right transplant kidney.  The cystoscope was removed and a 18 [place] council tip Foley was placed over the wire without difficulty.  The patient emerged anesthesia without complication.  And was transferred to the PACU in stable condition.  Follow up: Patient be discharged home with Foley catheter removal and 3 days.  Additionally, we need to discuss retained calcified stent in the transplant kidney and see who is managing this.  Jacalyn Lefevre, M.D.

## 2020-02-17 NOTE — Discharge Instructions (Signed)

## 2020-02-17 NOTE — Anesthesia Procedure Notes (Signed)
Procedure Name: LMA Insertion Date/Time: 02/17/2020 7:35 AM Performed by: West Pugh, CRNA Pre-anesthesia Checklist: Patient identified, Emergency Drugs available, Suction available, Patient being monitored and Timeout performed Patient Re-evaluated:Patient Re-evaluated prior to induction Oxygen Delivery Method: Circle system utilized Preoxygenation: Pre-oxygenation with 100% oxygen Induction Type: IV induction Ventilation: Mask ventilation without difficulty LMA: LMA with gastric port inserted LMA Size: 4.0 Number of attempts: 1 Placement Confirmation: positive ETCO2 Tube secured with: Tape Dental Injury: Teeth and Oropharynx as per pre-operative assessment

## 2020-02-17 NOTE — Interval H&P Note (Signed)
History and Physical Interval Note:  02/17/2020 7:13 AM  Charles [name]  has presented today for surgery, with the diagnosis of URETHRAL STRICTURE.  The various methods of treatment have been discussed with the patient and family. After consideration of risks, benefits and other options for treatment, the patient has consented to  Procedure(s) with comments: CYSTOSCOPY WITH DIRECT VISION INTERNAL URETHROTOMY (N/A) - 1 HR as a surgical intervention.  The patient's history has been reviewed, patient examined, no change in status, stable for surgery.  I have reviewed the patient's chart and labs.  Questions were answered to the patient's satisfaction.     Charles [name] D Elainna Eshleman

## 2020-02-18 ENCOUNTER — Encounter (HOSPITAL_COMMUNITY): Payer: Self-pay | Admitting: Urology

## 2021-07-29 ENCOUNTER — Other Ambulatory Visit: Payer: Self-pay | Admitting: Nephrology

## 2021-07-29 DIAGNOSIS — N189 Chronic kidney disease, unspecified: Secondary | ICD-10-CM

## 2021-07-29 DIAGNOSIS — I129 Hypertensive chronic kidney disease with stage 1 through stage 4 chronic kidney disease, or unspecified chronic kidney disease: Secondary | ICD-10-CM

## 2021-07-29 DIAGNOSIS — Z940 Kidney transplant status: Secondary | ICD-10-CM

## 2021-07-29 DIAGNOSIS — N182 Chronic kidney disease, stage 2 (mild): Secondary | ICD-10-CM

## 2021-08-10 ENCOUNTER — Ambulatory Visit
Admission: RE | Admit: 2021-08-10 | Discharge: 2021-08-10 | Disposition: A | Discharge status: Home / Self Care | Payer: Medicaid Other | Source: Ambulatory Visit | Attending: Nephrology | Admitting: Nephrology

## 2021-08-10 DIAGNOSIS — N189 Chronic kidney disease, unspecified: Secondary | ICD-10-CM

## 2021-08-10 DIAGNOSIS — N182 Chronic kidney disease, stage 2 (mild): Secondary | ICD-10-CM

## 2021-08-10 DIAGNOSIS — D631 Anemia in chronic kidney disease: Secondary | ICD-10-CM

## 2021-08-10 DIAGNOSIS — I129 Hypertensive chronic kidney disease with stage 1 through stage 4 chronic kidney disease, or unspecified chronic kidney disease: Secondary | ICD-10-CM

## 2021-08-10 DIAGNOSIS — Z940 Kidney transplant status: Secondary | ICD-10-CM

## 2022-05-22 ENCOUNTER — Encounter (HOSPITAL_COMMUNITY): Payer: Self-pay

## 2022-05-22 ENCOUNTER — Emergency Department (HOSPITAL_COMMUNITY): Payer: Medicare Other

## 2022-05-22 ENCOUNTER — Emergency Department (HOSPITAL_COMMUNITY)
Admission: EM | Admit: 2022-05-22 | Discharge: 2022-05-22 | Disposition: A | Discharge status: Home / Self Care | Payer: Medicare Other | Attending: Emergency Medicine | Admitting: Emergency Medicine

## 2022-05-22 ENCOUNTER — Other Ambulatory Visit: Payer: Self-pay

## 2022-05-22 DIAGNOSIS — W06XXXA Fall from bed, initial encounter: Secondary | ICD-10-CM | POA: Insufficient documentation

## 2022-05-22 DIAGNOSIS — N186 End stage renal disease: Secondary | ICD-10-CM | POA: Insufficient documentation

## 2022-05-22 DIAGNOSIS — S20212A Contusion of left front wall of thorax, initial encounter: Secondary | ICD-10-CM | POA: Insufficient documentation

## 2022-05-22 DIAGNOSIS — I120 Hypertensive chronic kidney disease with stage 5 chronic kidney disease or end stage renal disease: Secondary | ICD-10-CM | POA: Insufficient documentation

## 2022-05-22 DIAGNOSIS — S299XXA Unspecified injury of thorax, initial encounter: Secondary | ICD-10-CM | POA: Diagnosis present

## 2022-05-22 DIAGNOSIS — Z79899 Other long term (current) drug therapy: Secondary | ICD-10-CM | POA: Diagnosis not present

## 2022-05-22 DIAGNOSIS — E1122 Type 2 diabetes mellitus with diabetic chronic kidney disease: Secondary | ICD-10-CM | POA: Insufficient documentation

## 2022-05-22 DIAGNOSIS — Z794 Long term (current) use of insulin: Secondary | ICD-10-CM | POA: Insufficient documentation

## 2022-05-22 MED ORDER — OXYCODONE HCL 5 MG PO TABS
5.0000 mg | ORAL_TABLET | Freq: Once | ORAL | Status: AC
  Administered 2022-05-22: 5 mg via ORAL
  Filled 2022-05-22: qty 1

## 2022-05-22 MED ORDER — METHOCARBAMOL 500 MG PO TABS: 500.0000 mg | ORAL_TABLET | Freq: Three times a day (TID) | ORAL | 0 refills | Status: DC | PRN

## 2022-05-22 MED ORDER — OXYCODONE-ACETAMINOPHEN 5-325 MG PO TABS: 1.0000 | ORAL_TABLET | Freq: Once | ORAL | Status: DC

## 2022-05-22 MED ORDER — METHOCARBAMOL 500 MG PO TABS
750.0000 mg | ORAL_TABLET | Freq: Once | ORAL | Status: AC
  Administered 2022-05-22: 750 mg via ORAL
  Filled 2022-05-22: qty 2

## 2022-05-22 MED ORDER — LIDOCAINE 4 % EX PTCH: 1.0000 | MEDICATED_PATCH | CUTANEOUS | 0 refills | Status: AC

## 2022-05-22 MED ORDER — LIDOCAINE 5 % EX PTCH
1.0000 | MEDICATED_PATCH | CUTANEOUS | Status: DC
  Administered 2022-05-22: 1 via TRANSDERMAL
  Filled 2022-05-22: qty 1

## 2022-05-22 MED ORDER — OXYCODONE HCL 5 MG PO TABS: 5.0000 mg | ORAL_TABLET | Freq: Four times a day (QID) | ORAL | 0 refills | Status: AC | PRN

## 2022-05-22 NOTE — ED Provider Notes (Signed)
[facility] EMERGENCY DEPARTMENT Provider Note   CSN: [id number] Arrival date & time: 05/22/22  0037     History  Chief Complaint  Patient presents with   Rib Injury    Charles [name] is a 66 y.o. male.  HPI Patient presents for chest wall pain.  While getting out of bed this morning, he slipped and struck his posterior left ribs on a freestanding fan.  He has since had pain in his chest wall which is worsened with movements and inspiration.  Fall occurred approximately 3:30 AM.  He has taken Tylenol prior to arrival.  He denies any other areas of discomfort or suspected injury.  His medical history includes DM, HLD, anemia, ESRD s/p kidney transplant, latent TB, HTN.    Home Medications Prior to Admission medications   Medication Sig Start Date End Date Taking? Authorizing Provider  lidocaine (HM LIDOCAINE PATCH) 4 % Place 1 patch onto the skin daily for 7 days. 05/22/22 05/29/22 Yes Godfrey Pick, MD  methocarbamol (ROBAXIN) 500 MG tablet Take 1 tablet (500 mg total) by mouth every 8 (eight) hours as needed for muscle spasms. 05/22/22  Yes Godfrey Pick, MD  oxyCODONE (ROXICODONE) 5 MG immediate release tablet Take 1 tablet (5 mg total) by mouth every 6 (six) hours as needed for up to 5 days for severe pain. 05/22/22 05/27/22 Yes Godfrey Pick, MD  amLODipine (NORVASC) 10 MG tablet Take 10 mg by mouth daily.     [provider]  b complex-vitamin c-folic acid (NEPHRO-VITE) 0.8 MG TABS tablet Take 1 tablet by mouth daily.     [provider]  Blood Glucose Monitoring Suppl (ONETOUCH PING METER REMOTE) Supplies MISC by Does not apply route. Use as instructed to check blood sugar QID    [provider]  furosemide (LASIX) 80 MG tablet Take 80 mg by mouth daily.     [provider]  glucose blood test strip 1 each by Other route 4 (four) times daily. Use as instructed QID before a meal and nightly to obtain glucose reading.     [provider]  glucose blood test strip 1 each by Misc.(Non-Drug; Combo Route) route 4 times daily before meals and nightly. To obtain sample for glucose testing. One touch. 07/10/17   [provider]  hydrALAZINE (APRESOLINE) 50 MG tablet Take 50-100 mg by mouth See admin instructions. 50 mg in the morning, 100 mg in the evening    [provider]  insulin aspart (NOVOLOG FLEXPEN) 100 UNIT/ML FlexPen Inject 8 Units into the skin 3 (three) times daily with meals. 07/23/18   Shamleffer, Melanie Crazier, MD  insulin glargine (LANTUS SOLOSTAR) 100 UNIT/ML Solostar Pen Inject 25 Units into the skin daily. Patient taking differently: Inject 25 Units into the skin 2 (two) times daily.  12/16/19   Jaynee Eagles, PA-C  Insulin Pen Needle (PEN NEEDLES 31GX5/16") 31G X 8 MM MISC by Does not apply route 4 (four) times daily. 4 times daily with meals and nightly. To administer insulin by pen    [provider]  metoprolol tartrate (LOPRESSOR) 100 MG tablet Take 100 mg by mouth 2 (two) times daily. Take 1 table PO BID    [provider]  mycophenolate (MYFORTIC) 180 MG EC tablet Take 540 mg by mouth 2 (two) times daily. Take 3 table ('540mg'$  total)  PO BID    [provider]  [facility] DELICA LANCETS 04U MISC by Does not apply route 4 (four) times  daily. Check blood sugar QID before and meal and nightly    [provider]  predniSONE (DELTASONE) 5 MG tablet Take 5 mg by mouth daily with breakfast. Take 1 tablet PO daily    [provider]  tacrolimus (PROGRAF) 1 MG capsule Take 2 mg by mouth 2 (two) times daily.     [provider]  tacrolimus (PROGRAF) 5 MG capsule Take 5 mg by mouth 2 (two) times daily.     [provider]      Allergies    Patient has no known allergies.    Review of Systems   Review of Systems  Respiratory:         Left posterior lateral rib pain  All other systems reviewed and are  negative.   Physical Exam Updated Vital Signs BP (!) 129/58   Pulse 68   Temp 98 F (36.7 C) (Oral)   Resp 18   Ht '5\' 11"'$  (1.803 m)   Wt 96.2 kg   SpO2 98%   BMI 29.57 kg/m  Physical Exam Vitals and nursing note reviewed.  Constitutional:      General: He is not in acute distress.    Appearance: Normal appearance. He is well-developed. He is not ill-appearing, toxic-appearing or diaphoretic.  HENT:     Head: Normocephalic and atraumatic.     Right Ear: External ear normal.     Left Ear: External ear normal.     Nose: Nose normal.     Mouth/Throat:     Mouth: Mucous membranes are moist.     Pharynx: Oropharynx is clear.  Eyes:     Extraocular Movements: Extraocular movements intact.     Conjunctiva/sclera: Conjunctivae normal.  Cardiovascular:     Rate and Rhythm: Normal rate and regular rhythm.     Heart sounds: No murmur heard. Pulmonary:     Effort: No tachypnea, accessory muscle usage or respiratory distress.     Breath sounds: Normal breath sounds. No wheezing or rales.    Chest:     Chest wall: Tenderness present.  Abdominal:     General: There is no distension.     Palpations: Abdomen is soft.     Tenderness: There is no abdominal tenderness.  Musculoskeletal:        General: No swelling. Normal range of motion.     Cervical back: Normal range of motion and neck supple.     Right lower leg: No edema.     Left lower leg: No edema.  Skin:    General: Skin is warm and dry.     Capillary Refill: Capillary refill takes less than 2 seconds.  Neurological:     Mental Status: He is alert and oriented to person, place, and time.     Cranial Nerves: No cranial nerve deficit.     Sensory: No sensory deficit.     Motor: No weakness.     Coordination: Coordination normal.  Psychiatric:        Mood and Affect: Mood normal.        Behavior: Behavior normal.        Thought Content: Thought content normal.        Judgment: Judgment normal.     ED Results /  Procedures / Treatments   Labs (all labs ordered are listed, but only abnormal results are displayed) Labs Reviewed - No data to display  EKG EKG Interpretation  Date/Time:  Monday May 22 2022 09:10:57 EDT Ventricular Rate:  62 PR Interval:  158 QRS Duration: 104 QT Interval:  390 QTC Calculation: 396 R Axis:   2 Text Interpretation: Sinus rhythm LVH with secondary repolarization abnormality Confirmed by Godfrey Pick [phone]) on 05/22/2022 12:02:41 PM  Radiology CT Chest Wo Contrast  Result Date: 05/22/2022 CLINICAL DATA:  Trauma EXAM: CT CHEST WITHOUT CONTRAST TECHNIQUE: Multidetector CT imaging of the chest was performed following the standard protocol without IV contrast. RADIATION DOSE REDUCTION: This exam was performed according to the departmental dose-optimization program which includes automated exposure control, adjustment of the mA and/or kV according to patient size and/or use of iterative reconstruction technique. COMPARISON:  None Available. FINDINGS: Cardiovascular: No significant vascular findings. Normal heart size. No pericardial effusion. Mediastinum/Nodes: No enlarged mediastinal or axillary lymph nodes. Thyroid gland, trachea, and esophagus demonstrate no significant findings. Lungs/Pleura: Mild centrilobular emphysema. There is a 7 mm ground-glass pulmonary nodule of the right upper lobe (series 3, image 32). 3 mm solid left lower lobe subpleural pulmonary nodule (series 3, image 119). No pleural effusion or pneumothorax. Central airways are patent. Upper Abdomen: No acute abnormality. Musculoskeletal: No chest wall mass or suspicious bone lesions identified. IMPRESSION: 1. No evidence of acute traumatic injury to the chest. 2. 7 mm part-solid pulmonary nodule within the upper lobe,. Per Fleischner Society Guidelines, recommend a non-contrast Chest CT at 6-12 months to confirm persistence, then additional non-contrast Chest CTs every 2 years until 5 years. Reference:  Radiology. 2017; 284(1):228-43. Emphysema (ICD10-J43.9). Electronically Signed   By: Marin Roberts M.D.   On: 05/22/2022 10:03   DG Chest Portable 1 View  Result Date: 05/22/2022 CLINICAL DATA:  Fall.  Left rib pain with movement. EXAM: PORTABLE CHEST 1 VIEW COMPARISON:  Chest two views 12/16/2016 FINDINGS: Cardiac silhouette and mediastinal contours are within normal limits. The lungs are clear. No pleural effusion or pneumothorax. No acute displaced left-sided rib fracture is seen. IMPRESSION: 1. No acute cardiopulmonary process. 2. No acute displaced left-sided rib fracture. Electronically Signed   By: Yvonne Kendall M.D.   On: 05/22/2022 09:12    Procedures Procedures    Medications Ordered in ED Medications  lidocaine (LIDODERM) 5 % 1 patch (1 patch Transdermal Patch Applied 05/22/22 0909)  oxyCODONE (Oxy IR/ROXICODONE) immediate release tablet 5 mg (5 mg Oral Given 05/22/22 0909)  methocarbamol (ROBAXIN) tablet 750 mg (750 mg Oral Given 05/22/22 1108)  oxyCODONE (Oxy IR/ROXICODONE) immediate release tablet 5 mg (5 mg Oral Given 05/22/22 1107)    ED Course/ Medical Decision Making/ A&P                           Medical Decision Making Amount and/or Complexity of Data Reviewed Radiology: ordered.  Risk OTC drugs. Prescription drug management.   This patient presents to the ED for concern of fall, this involves an extensive number of treatment options, and is a complaint that carries with it a high risk of complications and morbidity.  The differential diagnosis includes rib fracture, chest wall contusion, pulmonary contusion, pneumothorax, hemothorax   Co morbidities that complicate the patient evaluation  DM, HLD, anemia, ESRD s/p kidney transplant, latent TB, HTN   Additional history obtained:  Additional history obtained from N/A External records from outside source obtained and reviewed including EMR   Imaging Studies ordered:  I ordered imaging studies including  chest x-ray, CT chest I independently visualized and interpreted imaging which showed no acute findings I agree with the radiologist interpretation   Cardiac  Monitoring: / EKG:  The patient was maintained on a cardiac monitor.  I personally viewed and interpreted the cardiac monitored which showed an underlying rhythm of: Sinus rhythm   Problem List / ED Course / Critical interventions / Medication management  Patient presents for left lower lateral chest wall pain following a fall that occurred earlier this morning.  During this fall, he describes a loss of balance causing him to land on a freestanding fan.  This fan struck him in the area where he is now experiencing pain.  His pain is worsened with tenderness and deep inspiration.  On arrival in the ED, patient's vital signs, including SPO2 on room air, are normal.  He is overall well-appearing.  He does appear uncomfortable.  He has tenderness in the posterior-lateral region of his lower left ribs.  Location of pain and tenderness is not consistent with cardiac etiology.  I suspect this is musculoskeletal from injury that he sustained during his fall.  He has taken Tylenol prior to arrival.  Roxicodone was ordered for additional analgesia.  Patient underwent chest imaging which was negative for rib fractures.  Robaxin and lidocaine patch were given for additional analgesia.  On reassessment, patient reports improved but continued pain.  Additional dose of oxycodone was given.  On further reassessment, patient reports significant improvement in his symptoms.  He was advised to continue multimodal pain control at home.  Prescriptions were provided.  He was discharged in good condition. I ordered medication including lidocaine patch, oxycodone, Robaxin for analgesia Reevaluation of the patient after these medicines showed that the patient improved I have reviewed the patients home medicines and have made adjustments as needed   Social Determinants  of Health:  Has access to outpatient care         Final Clinical Impression(s) / ED Diagnoses Final diagnoses:  Contusion of left chest wall, initial encounter    Rx / DC Orders ED Discharge Orders          Ordered    oxyCODONE (ROXICODONE) 5 MG immediate release tablet  Every 6 hours PRN        05/22/22 1157    methocarbamol (ROBAXIN) 500 MG tablet  Every 8 hours PRN        05/22/22 1157    lidocaine (HM LIDOCAINE PATCH) 4 %  Every 24 hours        05/22/22 1157              Godfrey Pick, MD 05/22/22 1204

## 2022-05-22 NOTE — Discharge Instructions (Addendum)
Continue to take Tylenol at home.  There were additional medications sent to your pharmacy to help control pain: -Oxycodone is a narcotic pain medication.  Take this only as needed.  Avoid driving, operating machinery, or putting yourself at risk for another fall while taking this medication. -Methocarbamol as a muscle relaxer.  Take this only as needed.  This can also contribute to increased risk of falls, especially when combined with narcotic pain medication.  Take with caution. -Lidocaine patches can be applied to your area of greatest pain and tenderness.  These typically last for 1 day.  Avoid having more than 1 patch on your skin at any given time.  Continue to take deep breaths periodically.  Continue to move within the tolerance of your pain.  Your soreness should begin to improve after tomorrow.  Return to the emergency department for any new or worsening symptoms of concern.

## 2022-05-22 NOTE — ED Triage Notes (Addendum)
Patient reports slipped getting out of bed to go to the bathroom and hit left ribs on standing fan which he fell on. Patient complains of pain in left ribs with movement, breathing sneezing. Patient is tearful in triage. Denies blood thinner

## 2022-08-10 ENCOUNTER — Other Ambulatory Visit: Payer: Self-pay | Admitting: Urology

## 2022-08-10 DIAGNOSIS — R972 Elevated prostate specific antigen [PSA]: Secondary | ICD-10-CM

## 2022-09-09 IMAGING — US US ABDOMEN COMPLETE
2 series · 13 of 25 positions shown · non-contrast
Comparison: None.

CLINICAL DATA: Deceased-donor kidney transplant. Chronic kidney
disease, stage II (mild). Hypertension, renal disease. Anemia
associated with chronic renal failure.

EXAM:
ABDOMEN ULTRASOUND COMPLETE

[Series 1: us abdomen complete · 0.23mm/px · 10 of 71 slices shown (1 of 2)]
[im 1/71]
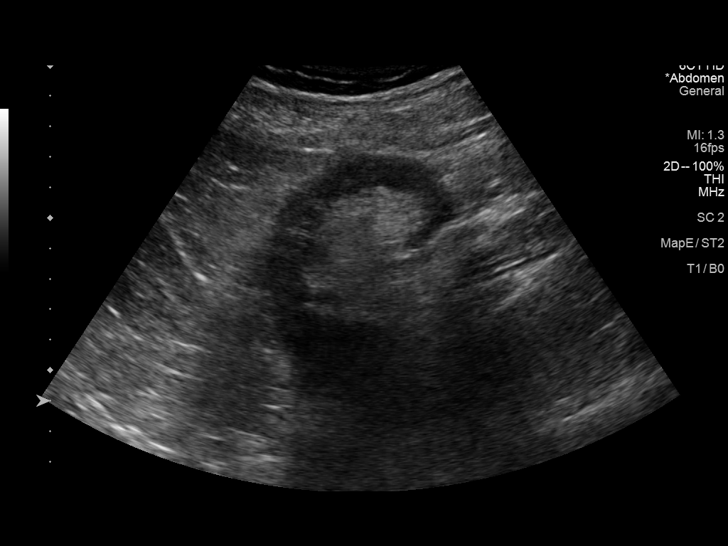
[im 8/71]
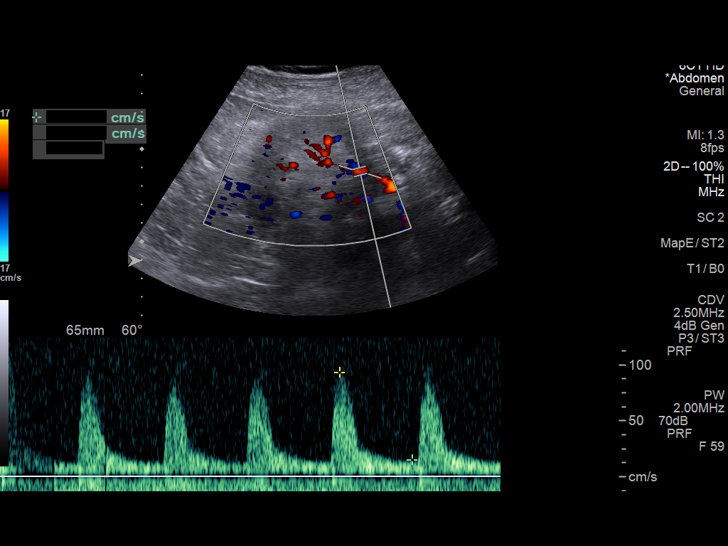
[im 16/71]
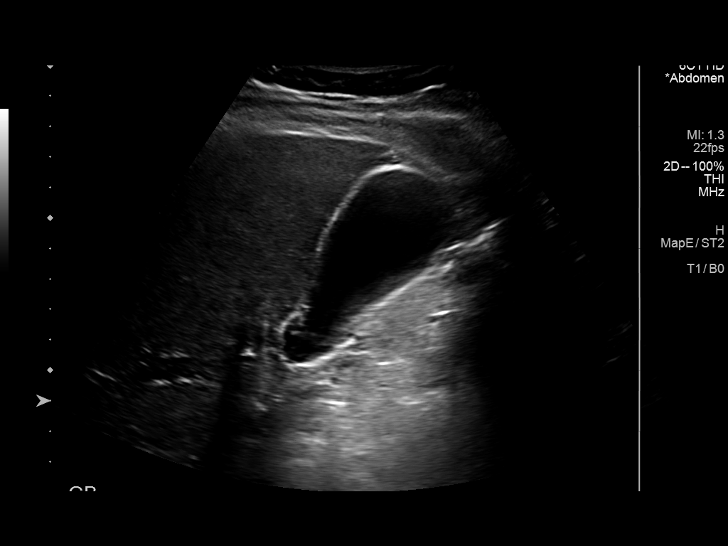
[im 24/71]
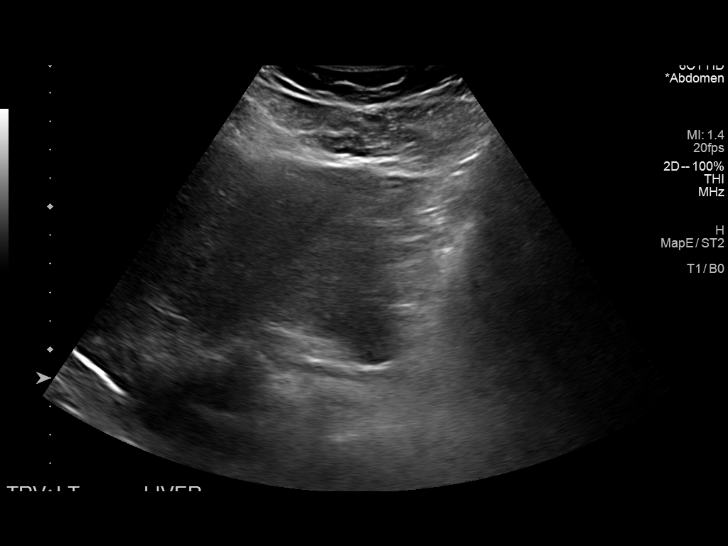
[im 32/71]
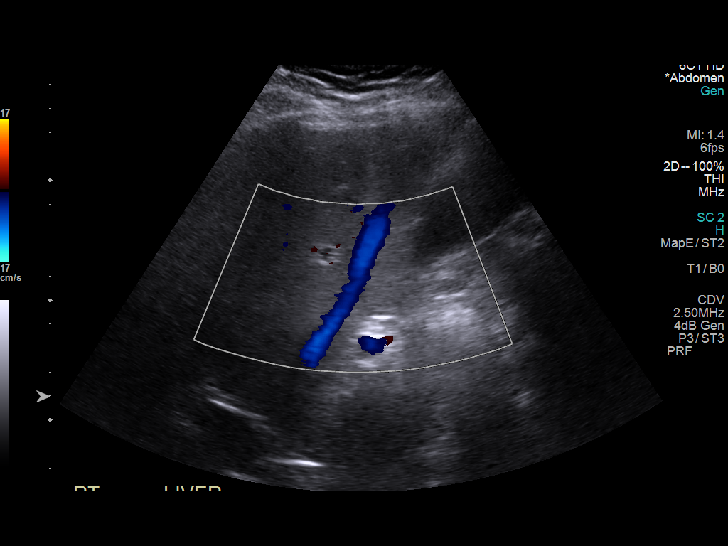
[im 39/71]
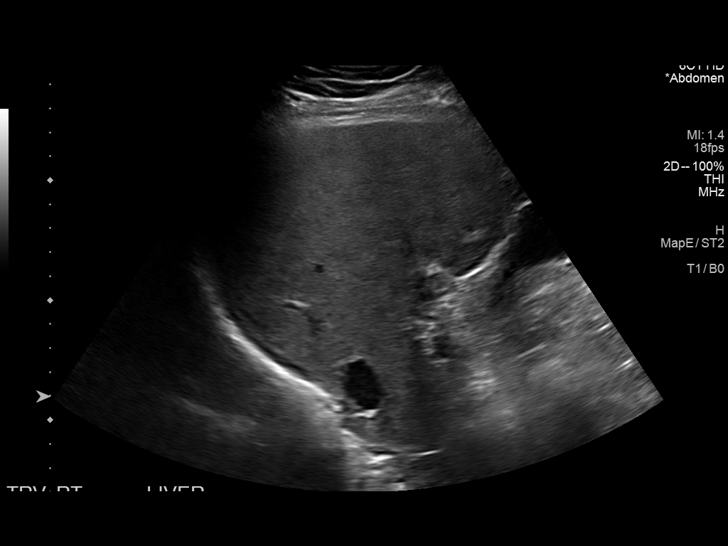
[im 47/71]
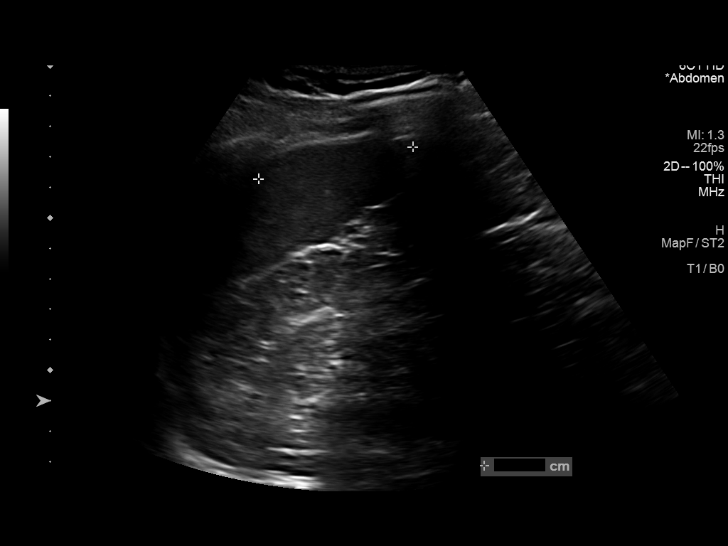
[im 55/71]
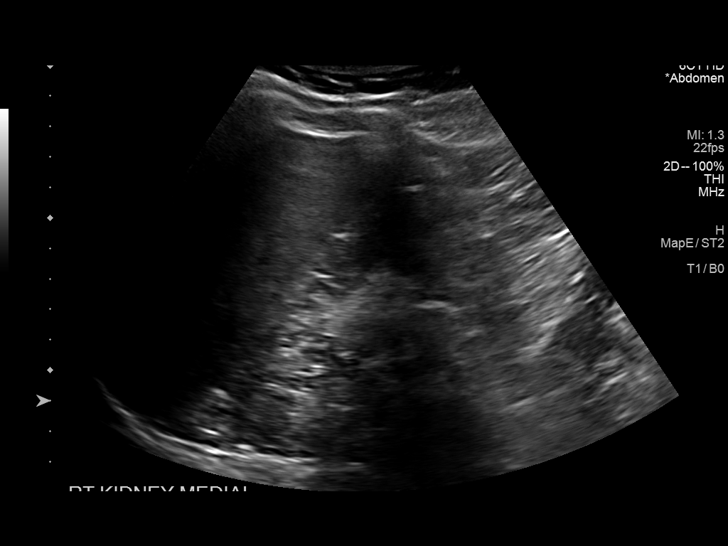
[im 63/71]
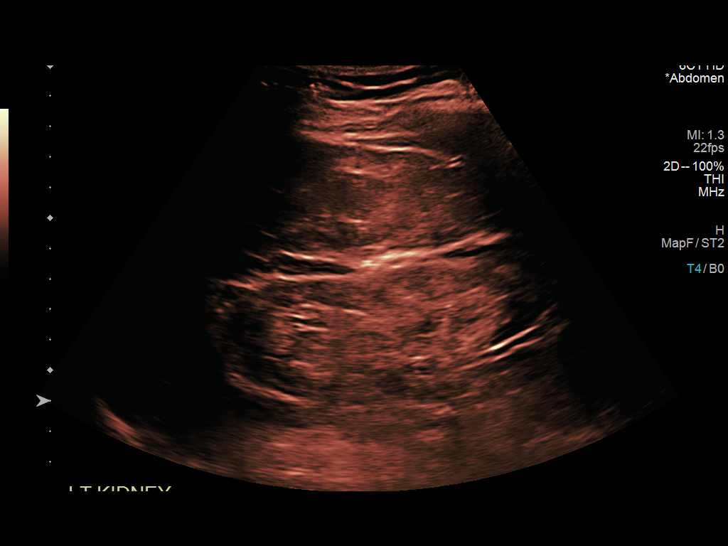
[im 71/71]
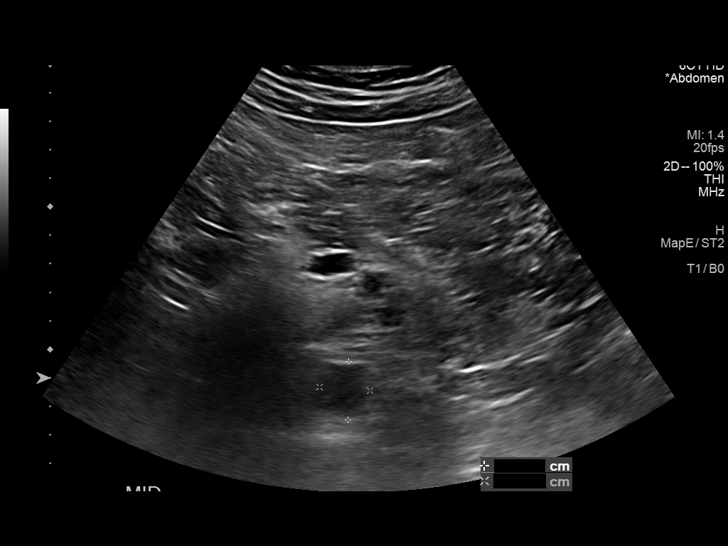

[Series 2: us abdomen complete · 0.23mm/px · 3 of 24 slices shown (2 of 2)]
[im 5/24]
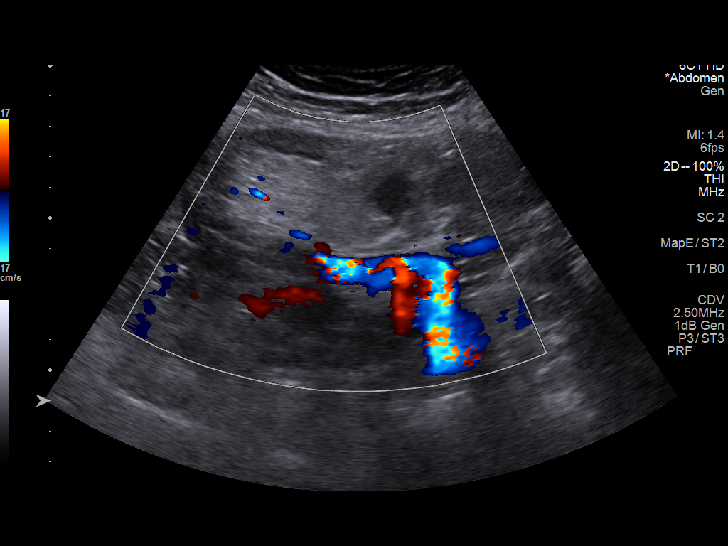
[im 14/24]
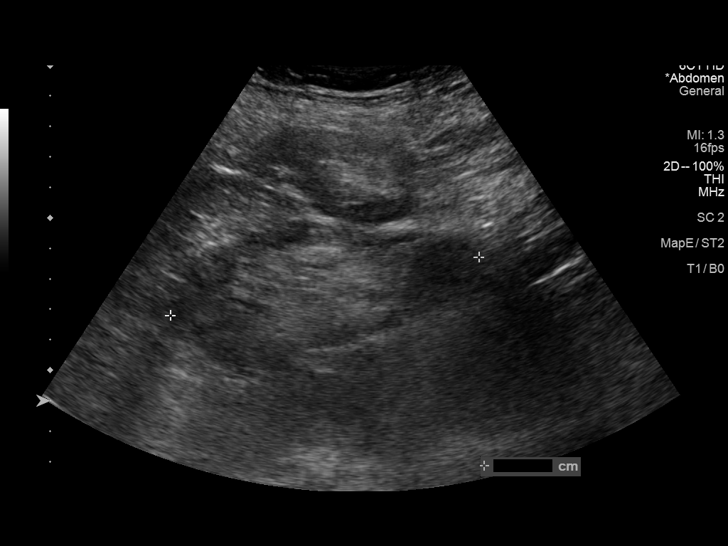
[im 24/24]
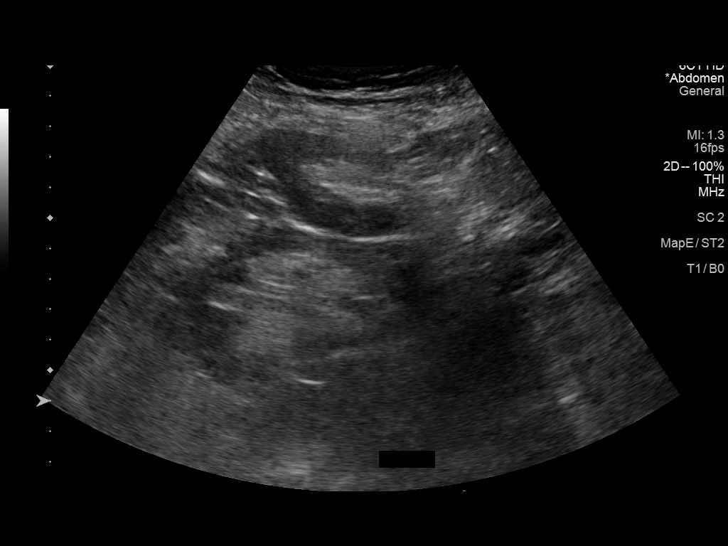

[13 of 25 positions shown; findings below may reference images not displayed]

FINDINGS: Gallbladder: No gallstones or wall thickening visualized. No
sonographic Murphy sign noted by sonographer.

Common bile duct: Diameter: 3 mm

Liver: No focal lesion identified. Within normal limits in
parenchymal echogenicity. Portal vein is patent on color Doppler
imaging with normal direction of blood flow towards the liver.

IVC: No abnormality visualized.

Pancreas: Not visible.

Spleen: Size and appearance within normal limits.

Native right Kidney: Length: 7.54 cm. Atrophic with thin cortex and
increased echogenicity.

Native left Kidney: Length: 10.21 cm. Cortical thinning with
increased echogenicity.

Abdominal aorta: No aneurysm visualized.

Other findings: Right lower quadrant transplant kidney measures
x 3.8 x 4.5 cm (volume = 74 cm^3). No hydronephrosis. Patent renal
transplant artery and vein. Peak systolic velocity in the main renal
artery is 93.5 cm/s. Resistive indices measure 0.84-0.87, previously
up to 0.75. note this was a limited Doppler evaluation assessing the
main renal artery and vein. Intrarenal arteries were not assessed.
IMPRESSION: Patent renal transplant vasculature. Mildly elevated resistive
indices measuring 0.84-0.87, slightly increased in comparison to
prior ultrasound in January 2020.

No other acute sonographic findings in the abdomen.

## 2022-09-13 ENCOUNTER — Other Ambulatory Visit: Payer: Self-pay | Admitting: Internal Medicine

## 2022-09-13 DIAGNOSIS — R911 Solitary pulmonary nodule: Secondary | ICD-10-CM

## 2022-09-24 ENCOUNTER — Ambulatory Visit
Admission: RE | Admit: 2022-09-24 | Discharge: 2022-09-24 | Disposition: A | Discharge status: Home / Self Care | Payer: Medicare Other | Source: Ambulatory Visit | Attending: Urology | Admitting: Urology

## 2022-09-24 DIAGNOSIS — R972 Elevated prostate specific antigen [PSA]: Secondary | ICD-10-CM

## 2022-09-24 MED ORDER — GADOPICLENOL 0.5 MMOL/ML IV SOLN
10.0000 mL | Freq: Once | INTRAVENOUS | Status: AC | PRN
  Administered 2022-09-24: 10 mL via INTRAVENOUS

## 2023-06-28 ENCOUNTER — Emergency Department (HOSPITAL_COMMUNITY): Payer: Medicare Other

## 2023-06-28 ENCOUNTER — Other Ambulatory Visit: Payer: Self-pay

## 2023-06-28 ENCOUNTER — Encounter (HOSPITAL_COMMUNITY): Payer: Self-pay | Admitting: Internal Medicine

## 2023-06-28 ENCOUNTER — Observation Stay (HOSPITAL_COMMUNITY)
Admission: EM | Admit: 2023-06-28 | Discharge: 2023-06-29 | Disposition: A | Discharge status: Home / Self Care | Payer: Medicare Other | Attending: Internal Medicine | Admitting: Internal Medicine

## 2023-06-28 DIAGNOSIS — N186 End stage renal disease: Secondary | ICD-10-CM | POA: Diagnosis not present

## 2023-06-28 DIAGNOSIS — A419 Sepsis, unspecified organism: Secondary | ICD-10-CM | POA: Diagnosis not present

## 2023-06-28 DIAGNOSIS — Z992 Dependence on renal dialysis: Secondary | ICD-10-CM | POA: Insufficient documentation

## 2023-06-28 DIAGNOSIS — R6520 Severe sepsis without septic shock: Secondary | ICD-10-CM | POA: Insufficient documentation

## 2023-06-28 DIAGNOSIS — E1122 Type 2 diabetes mellitus with diabetic chronic kidney disease: Secondary | ICD-10-CM | POA: Diagnosis not present

## 2023-06-28 DIAGNOSIS — Z940 Kidney transplant status: Secondary | ICD-10-CM | POA: Insufficient documentation

## 2023-06-28 DIAGNOSIS — R0981 Nasal congestion: Secondary | ICD-10-CM | POA: Diagnosis present

## 2023-06-28 DIAGNOSIS — Z794 Long term (current) use of insulin: Secondary | ICD-10-CM | POA: Diagnosis not present

## 2023-06-28 DIAGNOSIS — J189 Pneumonia, unspecified organism: Principal | ICD-10-CM | POA: Insufficient documentation

## 2023-06-28 DIAGNOSIS — Z79899 Other long term (current) drug therapy: Secondary | ICD-10-CM | POA: Insufficient documentation

## 2023-06-28 DIAGNOSIS — I120 Hypertensive chronic kidney disease with stage 5 chronic kidney disease or end stage renal disease: Secondary | ICD-10-CM | POA: Diagnosis not present

## 2023-06-28 DIAGNOSIS — Z1152 Encounter for screening for COVID-19: Secondary | ICD-10-CM | POA: Insufficient documentation

## 2023-06-28 DIAGNOSIS — N179 Acute kidney failure, unspecified: Secondary | ICD-10-CM | POA: Diagnosis not present

## 2023-06-28 LAB — I-STAT CG4 LACTIC ACID, ED: Lactic Acid, Venous: 2.4 mmol/L (ref 0.5–1.9)

## 2023-06-28 LAB — BASIC METABOLIC PANEL
Anion gap: 8 (ref 5–15)
BUN: 21 mg/dL (ref 8–23)
CO2: 24 mmol/L (ref 22–32)
Calcium: 9.6 mg/dL (ref 8.9–10.3)
Chloride: 106 mmol/L (ref 98–111)
Creatinine, Ser: 1.63 mg/dL — ABNORMAL HIGH (ref 0.61–1.24)
GFR, Estimated: 46 mL/min — ABNORMAL LOW (ref >60)
Glucose, Bld: 224 mg/dL — ABNORMAL HIGH (ref 70–99)
Potassium: 3.8 mmol/L (ref 3.5–5.1)
Sodium: 138 mmol/L (ref 135–145)

## 2023-06-28 LAB — CBC WITH DIFFERENTIAL/PLATELET
Abs Immature Granulocytes: 0.05 10*3/uL (ref 0.00–0.07)
Basophils Absolute: 0 10*3/uL (ref 0.0–0.1)
Basophils Relative: 0 %
Eosinophils Absolute: 0.1 10*3/uL (ref 0.0–0.5)
Eosinophils Relative: 0 %
HCT: 39.5 % (ref 39.0–52.0)
Hemoglobin: 12.3 g/dL — ABNORMAL LOW (ref 13.0–17.0)
Immature Granulocytes: 0 %
Lymphocytes Relative: 10 %
Lymphs Abs: 1.3 10*3/uL (ref 0.7–4.0)
MCH: 29 pg (ref 26.0–34.0)
MCHC: 31.1 g/dL (ref 30.0–36.0)
MCV: 93.2 fL (ref 80.0–100.0)
Monocytes Absolute: 0.7 10*3/uL (ref 0.1–1.0)
Monocytes Relative: 5 %
Neutro Abs: 11.3 10*3/uL — ABNORMAL HIGH (ref 1.7–7.7)
Neutrophils Relative %: 85 %
Platelets: 182 10*3/uL (ref 150–400)
RBC: 4.24 MIL/uL (ref 4.22–5.81)
RDW: 13.9 % (ref 11.5–15.5)
WBC: 13.4 10*3/uL — ABNORMAL HIGH (ref 4.0–10.5)
nRBC: 0 % (ref 0.0–0.2)

## 2023-06-28 LAB — RESPIRATORY PANEL BY PCR

## 2023-06-28 LAB — LACTIC ACID, PLASMA: Lactic Acid, Venous: 1.2 mmol/L (ref 0.5–1.9)

## 2023-06-28 LAB — GLUCOSE, CAPILLARY
Glucose-Capillary: 194 mg/dL — ABNORMAL HIGH (ref 70–99)
Glucose-Capillary: 231 mg/dL — ABNORMAL HIGH (ref 70–99)
Glucose-Capillary: 294 mg/dL — ABNORMAL HIGH (ref 70–99)

## 2023-06-28 LAB — HEMOGLOBIN A1C
Hgb A1c MFr Bld: 7.2 % — ABNORMAL HIGH (ref 4.8–5.6)
Mean Plasma Glucose: 159.94 mg/dL

## 2023-06-28 LAB — CG4 I-STAT (LACTIC ACID): Lactic Acid, Venous: 4.2 mmol/L (ref 0.5–1.9)

## 2023-06-28 LAB — RESP PANEL BY RT-PCR (RSV, FLU A&B, COVID)  RVPGX2
Influenza A by PCR: NEGATIVE
Influenza B by PCR: NEGATIVE
Resp Syncytial Virus by PCR: NEGATIVE
SARS Coronavirus 2 by RT PCR: NEGATIVE

## 2023-06-28 LAB — MRSA NEXT GEN BY PCR, NASAL: MRSA by PCR Next Gen: NOT DETECTED

## 2023-06-28 LAB — GROUP A STREP BY PCR: Group A Strep by PCR: NOT DETECTED

## 2023-06-28 MED ORDER — SODIUM CHLORIDE 0.9 % IV BOLUS
1000.0000 mL | Freq: Once | INTRAVENOUS | Status: AC
  Administered 2023-06-28: 1000 mL via INTRAVENOUS

## 2023-06-28 MED ORDER — CEFTRIAXONE SODIUM 2 G IJ SOLR: 2.0000 g | INTRAMUSCULAR | Status: DC

## 2023-06-28 MED ORDER — ORAL CARE MOUTH RINSE: 15.0000 mL | OROMUCOSAL | Status: DC | PRN

## 2023-06-28 MED ORDER — MENTHOL 3 MG MT LOZG: 1.0000 | LOZENGE | OROMUCOSAL | Status: DC | PRN

## 2023-06-28 MED ORDER — SODIUM CHLORIDE 0.9 % IV SOLN
2.0000 g | Freq: Once | INTRAVENOUS | Status: AC
  Administered 2023-06-28: 2 g via INTRAVENOUS
  Filled 2023-06-28: qty 20

## 2023-06-28 MED ORDER — SODIUM CHLORIDE 0.9 % IV SOLN
500.0000 mg | Freq: Once | INTRAVENOUS | Status: AC
  Administered 2023-06-28: 500 mg via INTRAVENOUS
  Filled 2023-06-28: qty 5

## 2023-06-28 MED ORDER — HYDRALAZINE HCL 50 MG PO TABS
100.0000 mg | ORAL_TABLET | Freq: Every evening | ORAL | Status: DC
  Administered 2023-06-28 – 2023-06-29 (×2): 100 mg via ORAL
  Filled 2023-06-28: qty 2

## 2023-06-28 MED ORDER — INSULIN GLARGINE-YFGN 100 UNIT/ML ~~LOC~~ SOLN
25.0000 [IU] | Freq: Two times a day (BID) | SUBCUTANEOUS | Status: DC
  Administered 2023-06-28 – 2023-06-29 (×2): 25 [IU] via SUBCUTANEOUS
  Filled 2023-06-28 (×3): qty 0.25

## 2023-06-28 MED ORDER — ONDANSETRON 4 MG PO TBDP: 4.0000 mg | ORAL_TABLET | Freq: Three times a day (TID) | ORAL | Status: DC | PRN

## 2023-06-28 MED ORDER — SALINE SPRAY 0.65 % NA SOLN: 1.0000 | NASAL | Status: DC | PRN

## 2023-06-28 MED ORDER — PREDNISONE 5 MG PO TABS
5.0000 mg | ORAL_TABLET | Freq: Every day | ORAL | Status: DC
  Administered 2023-06-29: 5 mg via ORAL
  Filled 2023-06-28: qty 1

## 2023-06-28 MED ORDER — HYDRALAZINE HCL 50 MG PO TABS
50.0000 mg | ORAL_TABLET | ORAL | Status: DC
  Filled 2023-06-28: qty 1

## 2023-06-28 MED ORDER — ONDANSETRON HCL 4 MG/2ML IJ SOLN: 4.0000 mg | Freq: Three times a day (TID) | INTRAMUSCULAR | Status: DC | PRN

## 2023-06-28 MED ORDER — SODIUM CHLORIDE 0.9 % IV SOLN: 2.0000 g | INTRAVENOUS | Status: DC

## 2023-06-28 MED ORDER — CALCITRIOL 0.5 MCG PO CAPS
0.5000 ug | ORAL_CAPSULE | Freq: Every day | ORAL | Status: DC
Start: 2023-06-28 — End: 2023-06-29
  Administered 2023-06-28 – 2023-06-29 (×2): 0.5 ug via ORAL
  Filled 2023-06-28 (×2): qty 1

## 2023-06-28 MED ORDER — LACTATED RINGERS IV BOLUS
1000.0000 mL | Freq: Once | INTRAVENOUS | Status: AC
  Administered 2023-06-28: 1000 mL via INTRAVENOUS

## 2023-06-28 MED ORDER — METOPROLOL TARTRATE 100 MG PO TABS
100.0000 mg | ORAL_TABLET | Freq: Two times a day (BID) | ORAL | Status: DC
  Administered 2023-06-28 – 2023-06-29 (×2): 100 mg via ORAL
  Filled 2023-06-28 (×2): qty 1

## 2023-06-28 MED ORDER — SODIUM CHLORIDE 0.9 % IV SOLN
500.0000 mg | INTRAVENOUS | Status: DC
  Filled 2023-06-28: qty 5

## 2023-06-28 MED ORDER — HEPARIN SODIUM (PORCINE) 5000 UNIT/ML IJ SOLN
5000.0000 [IU] | Freq: Three times a day (TID) | INTRAMUSCULAR | Status: DC
  Administered 2023-06-28 – 2023-06-29 (×3): 5000 [IU] via SUBCUTANEOUS
  Filled 2023-06-28 (×3): qty 1

## 2023-06-28 MED ORDER — INSULIN ASPART 100 UNIT/ML IJ SOLN
0.0000 [IU] | Freq: Three times a day (TID) | INTRAMUSCULAR | Status: DC
Start: 2023-06-28 — End: 2023-06-29
  Administered 2023-06-28: 5 [IU] via SUBCUTANEOUS
  Administered 2023-06-29: 2 [IU] via SUBCUTANEOUS
  Administered 2023-06-29: 8 [IU] via SUBCUTANEOUS

## 2023-06-28 MED ORDER — LACTATED RINGERS IV SOLN: INTRAVENOUS | Status: DC

## 2023-06-28 MED ORDER — TACROLIMUS 1 MG PO CAPS
2.0000 mg | ORAL_CAPSULE | Freq: Two times a day (BID) | ORAL | Status: DC
  Administered 2023-06-28 – 2023-06-29 (×2): 2 mg via ORAL
  Filled 2023-06-28 (×2): qty 2

## 2023-06-28 MED ORDER — MELATONIN 3 MG PO TABS
3.0000 mg | ORAL_TABLET | Freq: Every evening | ORAL | Status: DC | PRN
  Administered 2023-06-28: 3 mg via ORAL
  Filled 2023-06-28: qty 1

## 2023-06-28 MED ORDER — ACETAMINOPHEN 325 MG PO TABS
650.0000 mg | ORAL_TABLET | Freq: Once | ORAL | Status: AC
  Administered 2023-06-28: 650 mg via ORAL
  Filled 2023-06-28: qty 2

## 2023-06-28 MED ORDER — TACROLIMUS 1 MG PO CAPS
5.0000 mg | ORAL_CAPSULE | Freq: Two times a day (BID) | ORAL | Status: DC
  Administered 2023-06-28 – 2023-06-29 (×2): 5 mg via ORAL
  Filled 2023-06-28 (×2): qty 5

## 2023-06-28 MED ORDER — BENZONATATE 100 MG PO CAPS
200.0000 mg | ORAL_CAPSULE | Freq: Three times a day (TID) | ORAL | Status: DC | PRN
  Administered 2023-06-28: 200 mg via ORAL
  Filled 2023-06-28: qty 2

## 2023-06-28 MED ORDER — SODIUM CHLORIDE 0.9 % IV SOLN
1.0000 g | Freq: Once | INTRAVENOUS | Status: DC
  Filled 2023-06-28: qty 10

## 2023-06-28 MED ORDER — AMLODIPINE BESYLATE 10 MG PO TABS
10.0000 mg | ORAL_TABLET | Freq: Every day | ORAL | Status: DC
  Administered 2023-06-28 – 2023-06-29 (×2): 10 mg via ORAL
  Filled 2023-06-28 (×2): qty 1

## 2023-06-28 MED ORDER — PRAVASTATIN SODIUM 40 MG PO TABS
40.0000 mg | ORAL_TABLET | Freq: Every day | ORAL | Status: DC
  Administered 2023-06-28 – 2023-06-29 (×2): 40 mg via ORAL
  Filled 2023-06-28 (×2): qty 1

## 2023-06-28 MED ORDER — ACETAMINOPHEN 325 MG PO TABS: 650.0000 mg | ORAL_TABLET | Freq: Four times a day (QID) | ORAL | Status: DC | PRN

## 2023-06-28 MED ORDER — RENA-VITE PO TABS
1.0000 | ORAL_TABLET | Freq: Every day | ORAL | Status: DC
  Administered 2023-06-28: 1 via ORAL
  Filled 2023-06-28: qty 1

## 2023-06-28 NOTE — ED Triage Notes (Signed)
Patient arrives for eval of three days of nasal congestion, bilateral ear pain, and headache. Taking Tylenol at home with temporary relief.

## 2023-06-28 NOTE — H&P (Addendum)
History and Physical    Patient: Charles [name] VHQ:[id number] DOB: 1955/11/16 DOA: 06/28/2023 DOS: the patient was seen and examined on 06/28/2023 PCP: Charlane Ferretti, DO  Patient coming from: Home  Chief Complaint:  Chief Complaint  Patient presents with   Nasal Congestion   HPI: Charles [name] is a 67 y.o. male with medical history significant of  Type II DM, HTN, HLD, anemia, ESRD s/p kidney transplant, secondary hyperparathyroidism, anemia of chronic disease, GERD, and latent TB.  He presents to [facility] emergency department with a 3-day history of headache, nasal congestion, bilateral otalgia.  He denies fevers, fatigue, malaise, abdominal pain, nausea, vomiting, or diarrhea.  He denies vision changes, dizziness, photophobia, phonophobia. He denies chest pain, shortness of breath.  He has not had had any known sick contacts.  ED Course: On arrival to [facility] emergency department he was noted to be afebrile temp 37.8C, BP 166/69, HR 93, RR 16, SpO2 100% on room air.  Labs notable for leukocytosis WBC 13.4, lactic acidosis lactic 2.4.  Creatinine 1.63.  Glucose elevated 224.  He is COVID, flu, and RSV negative. Group A strep negative. Chest x-ray with bilateral interstitial prominence of airspace opacities in the right upper and right lower lobes.  Blood cultures obtained in the ED.  Patient started on Rocephin and Zithromax as well as given 1 L normal saline.  [facility] contacted for admission.  Review of Systems: As mentioned in the history of present illness. All other systems reviewed and are negative. Past Medical History:  Diagnosis Date   Anemia    Diabetes mellitus without complication (HCC)    Type 2    End stage kidney disease (HCC)    HD   M-W-F   Glaucoma    High cholesterol    Hypertension    Past Surgical History:  Procedure Laterality Date   APPENDECTOMY     AV FISTULA PLACEMENT Right 05/19/2013   Procedure: RIGHT BRACHIOCEPHALIC  ARTERIOVENOUS (AV) FISTULA  CREATION;  Surgeon: Fransisco Hertz, MD;  Location: [facility] OR;  Service: Vascular;  Laterality: Right;   CYSTOSCOPY WITH DIRECT VISION INTERNAL URETHROTOMY N/A 02/17/2020   Procedure: CYSTOSCOPY WITH DIRECT VISION INTERNAL URETHROTOMY;  Surgeon: Noel Christmas, MD;  Location: [facility] ORS;  Service: Urology;  Laterality: N/A;  1 HR   EYE SURGERY     KIDNEY TRANSPLANT Bilateral 02/2017   MULTIPLE EXTRACTIONS WITH ALVEOLOPLASTY N/A 01/28/2015   Procedure: Extraction of tooth #'s 1,2,15,16,17,32 with alveoloplasty and gross debridement of remaining teeth.;  Surgeon: Charlynne Pander, DDS;  Location: [facility] OR;  Service: Oral Surgery;  Laterality: N/A;   PROSTATE BIOPSY     Social History:  reports that he has never smoked. He has never used smokeless tobacco. He reports current alcohol use. He reports that he does not use drugs.  No Known Allergies  Family History  Problem Relation Age of Onset   Diabetes Mother    Colon cancer Neg Hx     Prior to Admission medications   Medication Sig Start Date End Date Taking? Authorizing Provider  amLODipine (NORVASC) 10 MG tablet Take 10 mg by mouth daily.     [provider]  b complex-vitamin c-folic acid (NEPHRO-VITE) 0.8 MG TABS tablet Take 1 tablet by mouth daily.     [provider]  Blood Glucose Monitoring Suppl (ONETOUCH PING METER REMOTE) Supplies MISC by Does not apply route. Use as instructed to check blood sugar QID    [provider]  furosemide (LASIX) 80 MG tablet Take 80 mg by mouth daily.     [provider]  glucose blood test strip 1 each by Other route 4 (four) times daily. Use as instructed QID before a meal and nightly to obtain glucose reading.    [provider]  glucose blood test strip 1 each by Misc.(Non-Drug; Combo Route) route 4 times daily before meals and nightly. To obtain sample for glucose testing. One touch. 07/10/17   [provider]  hydrALAZINE (APRESOLINE) 50 MG tablet Take  50-100 mg by mouth See admin instructions. 50 mg in the morning, 100 mg in the evening    [provider]  insulin aspart (NOVOLOG FLEXPEN) 100 UNIT/ML FlexPen Inject 8 Units into the skin 3 (three) times daily with meals. 07/23/18   Shamleffer, Konrad Dolores, MD  insulin glargine (LANTUS SOLOSTAR) 100 UNIT/ML Solostar Pen Inject 25 Units into the skin daily. Patient taking differently: Inject 25 Units into the skin 2 (two) times daily.  12/16/19   Wallis Bamberg, PA-C  Insulin Pen Needle (PEN NEEDLES 31GX5/16") 31G X 8 MM MISC by Does not apply route 4 (four) times daily. 4 times daily with meals and nightly. To administer insulin by pen    [provider]  methocarbamol (ROBAXIN) 500 MG tablet Take 1 tablet (500 mg total) by mouth every 8 (eight) hours as needed for muscle spasms. 05/22/22   Gloris Manchester, MD  metoprolol tartrate (LOPRESSOR) 100 MG tablet Take 100 mg by mouth 2 (two) times daily. Take 1 table PO BID    [provider]  mycophenolate (MYFORTIC) 180 MG EC tablet Take 540 mg by mouth 2 (two) times daily. Take 3 table (540mg  total)  PO BID    [provider]  [facility] DELICA LANCETS 33G MISC by Does not apply route 4 (four) times daily. Check blood sugar QID before and meal and nightly    [provider]  predniSONE (DELTASONE) 5 MG tablet Take 5 mg by mouth daily with breakfast. Take 1 tablet PO daily    [provider]  tacrolimus (PROGRAF) 1 MG capsule Take 2 mg by mouth 2 (two) times daily.     [provider]  tacrolimus (PROGRAF) 5 MG capsule Take 5 mg by mouth 2 (two) times daily.     [provider]    Physical Exam: Vitals:   06/28/23 0950 06/28/23 1120 06/28/23 1125  BP: (!) 166/69 (!) 155/67   Pulse: 93 70 73  Resp: 16 (!) 21 20  Temp: 100 F (37.8 C)    TempSrc: Oral    SpO2: 100% 99% 99%    Constitutional: NAD, calm, comfortable Eyes: PERRL, lids and conjunctivae normal ENMT: Mucous membranes  are moist. Posterior pharynx clear of any exudate or lesions.Normal dentition.  Neck: normal, supple, no masses, no thyromegaly Respiratory: clear to auscultation bilaterally, no wheezing, no crackles. Normal respiratory effort. No accessory muscle use.  Cardiovascular: Regular rate and rhythm, no murmurs / rubs / gallops. No extremity edema. 2+ pedal pulses. No carotid bruits.  Abdomen: no tenderness, no masses palpated. No hepatosplenomegaly. Bowel sounds positive.  Musculoskeletal: no clubbing / cyanosis. No joint deformity upper and lower extremities. Good ROM, no contractures. Normal muscle tone.  Skin: no rashes, lesions, ulcers. No induration Neurologic: CN 2-12 grossly intact. Sensation intact, DTR normal. Strength 5/5 x all 4 extremities.  Psychiatric: Normal judgment and insight. Alert and oriented x 3. Normal mood.   Data Reviewed: CBC  Component Value Date/Time   WBC 13.4 (H) 06/28/2023 1013   RBC 4.24 06/28/2023 1013   HGB 12.3 (L) 06/28/2023 1013   HCT 39.5 06/28/2023 1013   PLT 182 06/28/2023 1013   MCV 93.2 06/28/2023 1013   MCH 29.0 06/28/2023 1013   MCHC 31.1 06/28/2023 1013   RDW 13.9 06/28/2023 1013   LYMPHSABS 1.3 06/28/2023 1013   MONOABS 0.7 06/28/2023 1013   EOSABS 0.1 06/28/2023 1013   BASOSABS 0.0 06/28/2023 1013      Latest Ref Rng & Units 06/28/2023   10:13 AM 02/09/2020    4:05 PM 12/16/2019   10:47 AM  BMP  Glucose 70 - 99 mg/dL 119  147  829   BUN 8 - 23 mg/dL 21  27  19    Creatinine 0.61 - 1.24 mg/dL 5.62  1.30  8.65   Sodium 135 - 145 mmol/L 138  142  140   Potassium 3.5 - 5.1 mmol/L 3.8  3.9  4.7   Chloride 98 - 111 mmol/L 106  106  104   CO2 22 - 32 mmol/L 24  27  26    Calcium 8.9 - 10.3 mg/dL 9.6  78.4  9.9    Lactic Acid, Venous    Component Value Date/Time   LATICACIDVEN 2.4 (HH) 06/28/2023 1018   Results for orders placed or performed during the hospital encounter of 06/28/23  Resp panel by RT-PCR (RSV, Flu A&B, Covid) Anterior  Nasal Swab     Status: None   Collection Time: 06/28/23  9:55 AM   Specimen: Anterior Nasal Swab  Result Value Ref Range Status   SARS Coronavirus 2 by RT PCR NEGATIVE NEGATIVE Final   Influenza A by PCR NEGATIVE NEGATIVE Final   Influenza B by PCR NEGATIVE NEGATIVE Final    Comment: (NOTE) The Xpert Xpress SARS-CoV-2/FLU/RSV plus assay is intended as an aid in the diagnosis of influenza from Nasopharyngeal swab specimens and should not be used as a sole basis for treatment. Nasal washings and aspirates are unacceptable for Xpert Xpress SARS-CoV-2/FLU/RSV testing.  Fact Sheet for Patients: BloggerCourse.com  Fact Sheet for Healthcare Providers: SeriousBroker.it  This test is not yet approved or cleared by the [place] FDA and has been authorized for detection and/or diagnosis of SARS-CoV-2 by FDA under an Emergency Use Authorization (EUA). This EUA will remain in effect (meaning this test can be used) for the duration of the COVID-19 declaration under Section 564(b)(1) of the Act, 21 U.S.C. section 360bbb-3(b)(1), unless the authorization is terminated or revoked.     Resp Syncytial Virus by PCR NEGATIVE NEGATIVE Final    Comment: (NOTE) Fact Sheet for Patients: BloggerCourse.com  Fact Sheet for Healthcare Providers: SeriousBroker.it  This test is not yet approved or cleared by the [place] FDA and has been authorized for detection and/or diagnosis of SARS-CoV-2 by FDA under an Emergency Use Authorization (EUA). This EUA will remain in effect (meaning this test can be used) for the duration of the COVID-19 declaration under Section 564(b)(1) of the Act, 21 U.S.C. section 360bbb-3(b)(1), unless the authorization is terminated or revoked.  Performed at [facility], [address], [place], [place] [address]   Group A Strep by PCR     Status: None    Collection Time: 06/28/23 10:13 AM   Specimen: Anterior Nasal Swab; Sterile Swab  Result Value Ref Range Status   Group A Strep by PCR NOT DETECTED NOT DETECTED Final    Comment: Performed at [facility], [address] [place], [place] [address]   DG Chest Portable 1 View  Result Date: 06/28/2023 CLINICAL DATA:  Shortness of breath EXAM: PORTABLE CHEST 1 VIEW COMPARISON:  05/22/2022 FINDINGS: The heart size and mediastinal contours are within normal limits. Bilateral interstitial prominence with airspace opacities in the right upper and right lower lobes. Airspace disease is most confluent within the inferior aspect of the right upper lobe. No pleural effusion or pneumothorax. The visualized skeletal structures are unremarkable. IMPRESSION: Bilateral interstitial prominence with airspace opacities in the right upper and right lower lobes, suggestive of multifocal pneumonia. Electronically Signed   By: Duanne Guess D.O.   On: 06/28/2023 10:40    Assessment and Plan: #Severe Sepsis #Community Acquired Pneumonia CXR with bilateral interstitial prominence with airspace opacities in the (R) upper and lower lobes. SIRs criteria: RR 21, WBC 13.4 Lactic Acidosis: 2.4 - Abx : Rocephin and Azithromycin - IV Fluids: 75 mL/hr - Respiratory 20 pathogen panel - Follow Blood and cultures - Follow Fever curve  #Acute Kidney Injury #s/p DDKT Transplant Creatinine 1.63, last available documented Creatinine 1.34 in August 2022. See [facility] note in media tab - IVF hydration - Hold home Lasix, Olmesartan - Hold Cellcept - Continue Prograf, Prednisone - Avoid nephrotoxin contrast Dyes, Hypotension and Dehydration to Ensure Adequate Renal Perfusion and will need to Renally Adjust Meds - Continue to Monitor and Trend Renal Function carefully and repeat BMP with AM labs    #Diabetes Mellitus, Type II - Continue home Lantus 25U BID  - ACHS CBG Monitoring and  SSI  #Hyperlipidemia -Continue home pravastatin  #Hypertension - Continue home amlodipine, hydralazine, metoprolol  - Hold home Olmesartan  #Secondary hyperparathyroidism -Continue home calcitriol  VTE prophylaxis: Heparin  GI prophylaxis: Not indicated  Diet: Regular Access: PIV Lines: NONE Code Status: FULL Telemetry: Disposition:  Advance Care Planning:   Code Status: Full Code   Consults: None called.  Family Communication: Daughter and son at bedside  Severity of Illness: The appropriate patient status for this patient is INPATIENT. Inpatient status is judged to be reasonable and necessary in order to provide the required intensity of service to ensure the patient's safety. The patient's presenting symptoms, physical exam findings, and initial radiographic and laboratory data in the context of their chronic comorbidities is felt to place them at high risk for further clinical deterioration. Furthermore, it is not anticipated that the patient will be medically stable for discharge from the hospital within 2 midnights of admission.   * I certify that at the point of admission it is my clinical judgment that the patient will require inpatient hospital care spanning beyond 2 midnights from the point of admission due to high intensity of service, high risk for further deterioration and high frequency of surveillance required.*  To reach the provider On-Call:   7AM- 7PM see care teams to locate the attending and reach out to them via [email or web address]. Password: [email or web address] 7PM-7AM contact night-coverage If you still have difficulty reaching the appropriate provider, please page the [facility] (Director on Call) for [facility] on amion for assistance  This document was prepared using Conservation officer, historic buildings and may include unintentional dictation errors.  Bishop Limbo FNP-BC, PMHNP-BC Nurse Practitioner [facility]

## 2023-06-28 NOTE — ED Provider Notes (Signed)
[place] EMERGENCY DEPARTMENT AT [facility] Provider Note   CSN: [id number] Arrival date & time: 06/28/23  1308     History  Chief Complaint  Patient presents with   Nasal Congestion    Charles [name] is a 67 y.o. male with a past medical history significant for diabetes, hyperlipidemia, hypertension, ESRD status post kidney transplant, immunosuppressive state, history of latent TB who presents to the ED due to nasal congestion, bilateral otalgia, and headache x 3 days.  No sick contacts or COVID exposures.  Denies chest pain and shortness of breath.  He has been taking Tylenol with mild relief.  Denies neck stiffness.  No fever.  Denies nausea, vomiting, diarrhea.  No abdominal pain.  History obtained from patient and past medical records. No interpreter used during encounter.       Home Medications Prior to Admission medications   Medication Sig Start Date End Date Taking? Authorizing Provider  amLODipine (NORVASC) 10 MG tablet Take 10 mg by mouth daily.     [provider]  b complex-vitamin c-folic acid (NEPHRO-VITE) 0.8 MG TABS tablet Take 1 tablet by mouth daily.     [provider]  Blood Glucose Monitoring Suppl (ONETOUCH PING METER REMOTE) Supplies MISC by Does not apply route. Use as instructed to check blood sugar QID    [provider]  furosemide (LASIX) 80 MG tablet Take 80 mg by mouth daily.     [provider]  glucose blood test strip 1 each by Other route 4 (four) times daily. Use as instructed QID before a meal and nightly to obtain glucose reading.    [provider]  glucose blood test strip 1 each by Misc.(Non-Drug; Combo Route) route 4 times daily before meals and nightly. To obtain sample for glucose testing. One touch. 07/10/17   [provider]  hydrALAZINE (APRESOLINE) 50 MG tablet Take 50-100 mg by mouth See admin instructions. 50 mg in the morning, 100 mg in the evening    [provider]  insulin aspart (NOVOLOG FLEXPEN) 100 UNIT/ML FlexPen Inject 8 Units into the skin 3 (three) times daily with meals. 07/23/18   Shamleffer, Konrad Dolores, MD  insulin glargine (LANTUS SOLOSTAR) 100 UNIT/ML Solostar Pen Inject 25 Units into the skin daily. Patient taking differently: Inject 25 Units into the skin 2 (two) times daily.  12/16/19   Wallis Bamberg, PA-C  Insulin Pen Needle (PEN NEEDLES 31GX5/16") 31G X 8 MM MISC by Does not apply route 4 (four) times daily. 4 times daily with meals and nightly. To administer insulin by pen    [provider]  methocarbamol (ROBAXIN) 500 MG tablet Take 1 tablet (500 mg total) by mouth every 8 (eight) hours as needed for muscle spasms. 05/22/22   Gloris Manchester, MD  metoprolol tartrate (LOPRESSOR) 100 MG tablet Take 100 mg by mouth 2 (two) times daily. Take 1 table PO BID    [provider]  mycophenolate (MYFORTIC) 180 MG EC tablet Take 540 mg by mouth 2 (two) times daily.    [provider]  [facility] DELICA LANCETS 33G MISC by Does not apply route 4 (four) times daily. Check blood sugar QID before and meal and nightly    [provider]  predniSONE (DELTASONE) 5 MG tablet Take 5 mg by mouth daily with breakfast. Take 1 tablet PO daily    [provider]  tacrolimus (PROGRAF) 1 MG capsule Take 2 mg by mouth 2 (two) times daily.  [provider]  tacrolimus (PROGRAF) 5 MG capsule Take 5 mg by mouth 2 (two) times daily.     [provider]      Allergies    Patient has no known allergies.    Review of Systems   Review of Systems  Constitutional:  Negative for fever.  HENT:  Positive for congestion, ear pain and sore throat. Negative for trouble swallowing and voice change.   Respiratory:  Negative for shortness of breath.   Cardiovascular:  Negative for chest pain.    Physical Exam Updated Vital Signs BP (!) 155/67   Pulse 73   Temp 100 F (37.8 C) (Oral)   Resp 20    SpO2 99%  Physical Exam Vitals and nursing note reviewed.  Constitutional:      General: He is not in acute distress.    Appearance: He is not ill-appearing.  HENT:     Head: Normocephalic.     Mouth/Throat:     Comments: Posterior oropharynx clear and mucous membranes moist, there is mild erythema but no edema or tonsillar exudates, uvula midline, normal phonation, no trismus, tolerating secretions without difficulty. Eyes:     Pupils: Pupils are equal, round, and reactive to light.  Neck:     Comments: No meningismus. Cardiovascular:     Rate and Rhythm: Normal rate and regular rhythm.     Pulses: Normal pulses.     Heart sounds: Normal heart sounds. No murmur heard.    No friction rub. No gallop.  Pulmonary:     Effort: Pulmonary effort is normal.     Breath sounds: Normal breath sounds.     Comments: Respirations equal and unlabored, patient able to speak in full sentences, lungs clear to auscultation bilaterally Abdominal:     General: Abdomen is flat. There is no distension.     Palpations: Abdomen is soft.     Tenderness: There is no abdominal tenderness. There is no guarding or rebound.  Musculoskeletal:        General: Normal range of motion.     Cervical back: Neck supple.  Skin:    General: Skin is warm and dry.  Neurological:     General: No focal deficit present.     Mental Status: He is alert.  Psychiatric:        Mood and Affect: Mood normal.        Behavior: Behavior normal.     ED Results / Procedures / Treatments   Labs (all labs ordered are listed, but only abnormal results are displayed) Labs Reviewed  CBC WITH DIFFERENTIAL/PLATELET - Abnormal; Notable for the following components:      Result Value   WBC 13.4 (*)    Hemoglobin 12.3 (*)    Neutro Abs 11.3 (*)    All other components within normal limits  BASIC METABOLIC PANEL - Abnormal; Notable for the following components:   Glucose, Bld 224 (*)    Creatinine, Ser 1.63 (*)    GFR, Estimated  46 (*)    All other components within normal limits  I-STAT CG4 LACTIC ACID, ED - Abnormal; Notable for the following components:   Lactic Acid, Venous 2.4 (*)    All other components within normal limits  RESP PANEL BY RT-PCR (RSV, FLU A&B, COVID)  RVPGX2  GROUP A STREP BY PCR  CULTURE, BLOOD (ROUTINE X 2)  CULTURE, BLOOD (ROUTINE X 2)  RESPIRATORY PANEL BY PCR  I-STAT CG4 LACTIC ACID, ED  EKG EKG Interpretation Date/Time:  Thursday June 28 2023 11:21:28 EST Ventricular Rate:  72 PR Interval:  143 QRS Duration:  98 QT Interval:  371 QTC Calculation: 406 R Axis:   -25  Text Interpretation: Sinus rhythm Ventricular trigeminy Probable left ventricular hypertrophy increased ectopy from prior 5/21 Confirmed by Meridee Score [phone]) on 06/28/2023 11:26:58 AM  Radiology DG Chest Portable 1 View  Result Date: 06/28/2023 CLINICAL DATA:  Shortness of breath EXAM: PORTABLE CHEST 1 VIEW COMPARISON:  05/22/2022 FINDINGS: The heart size and mediastinal contours are within normal limits. Bilateral interstitial prominence with airspace opacities in the right upper and right lower lobes. Airspace disease is most confluent within the inferior aspect of the right upper lobe. No pleural effusion or pneumothorax. The visualized skeletal structures are unremarkable. IMPRESSION: Bilateral interstitial prominence with airspace opacities in the right upper and right lower lobes, suggestive of multifocal pneumonia. Electronically Signed   By: Duanne Guess D.O.   On: 06/28/2023 10:40    Procedures Procedures    Medications Ordered in ED Medications  cefTRIAXone (ROCEPHIN) 1 g in sodium chloride 0.9 % 100 mL IVPB (has no administration in time range)  azithromycin (ZITHROMAX) 500 mg in sodium chloride 0.9 % 250 mL IVPB (500 mg Intravenous New Bag/Given 06/28/23 1146)  acetaminophen (TYLENOL) tablet 650 mg (650 mg Oral Given 06/28/23 1037)  sodium chloride 0.9 % bolus 1,000 mL (1,000 mLs  Intravenous New Bag/Given 06/28/23 1122)    ED Course/ Medical Decision Making/ A&P Clinical Course as of 06/28/23 1227  Thu Jun 28, 2023  1046 WBC(!): 13.4 [CA]  1046 Lactic Acid, Venous(!!): 2.4 CXR demonstrates multifocal pneumonia. Lactic acid elevated. IVFs and antibiotics started. Blood cultures added.  [CA]  1059 He has a history of  renal transplant on immunosuppression here with few days of URI symptoms.  X-ray showing pneumonia.  Lab work showing elevated white count elevated lactate.  Getting fluids and antibiotics.  Probable admission [MB]    Clinical Course User Index [CA] Mannie Stabile, PA-C [MB] Terrilee Files, MD                                 Medical Decision Making Amount and/or Complexity of Data Reviewed External Data Reviewed: notes. Labs: ordered. Decision-making details documented in ED Course. Radiology: ordered and independent interpretation performed. Decision-making details documented in ED Course.  Risk OTC drugs. Decision regarding hospitalization.   This patient presents to the ED for concern of URI symptoms, this involves an extensive number of treatment options, and is a complaint that carries with it a high risk of complications and morbidity.  The differential diagnosis includes viral process, PNA, meningitis, etc  67 year old male with a history of renal transplant presents to the ED due to nasal congestion, bilateral otalgia, and headache x 3 days.  Also endorses a sore throat.  No fever or chills.  No sick contacts.  No neck stiffness.  Upon arrival patient borderline febrile at 100 F with otherwise reassuring vitals.  Patient in no acute distress.  Patient nontoxic-appearing.  Reassuring physical exam.  Lungs clear to auscultation bilaterally.  No meningismus to suggest meningitis. TM with some serous fluid, but no evidence of AOM. Throat with mild erythema.  Uvula midline.  No abscess.  No tonsillar exudates.  Given patient's  immunocompromised state routine labs ordered.  Chest x-ray to rule out evidence of pneumonia.  RVP and strep test ordered.  Tylenol given.   CBC significant for leukocytosis at 3.4.  Lactic acid elevated at 2.4.  Added blood cultures.  IVFs started.  Chest x-ray personally reviewed and interpreted which demonstrates multifocal pneumonia.  IV antibiotics started.  COVID/influenza negative.  Strep negative.  BMP with elevated creatinine 1.63 and hyperglycemia 224.  No anion gap.  Low suspicion for DKA.  No major electrolyte derangements.  Given immunocompromised state feel patient would benefit from admission for multifocal pneumonia.  Discussed with Dr. Charm Barges who evaluated patient at bedside and agrees with assessment and plan.   Discussed with Katie with [facility] who agrees to admit patient.        Final Clinical Impression(s) / ED Diagnoses Final diagnoses:  Multifocal pneumonia    Rx / DC Orders ED Discharge Orders     None         Jesusita Oka 06/28/23 1227    Terrilee Files, MD 06/28/23 1712

## 2023-06-28 NOTE — Progress Notes (Signed)
TR[facility] night cross cover note:   I was notified by RN at the patient's request for antitussive medication.  I subsequently added prn Tessalon Perles.     Newton Pigg, DO Hospitalist

## 2023-06-29 ENCOUNTER — Other Ambulatory Visit (HOSPITAL_COMMUNITY): Payer: Self-pay

## 2023-06-29 DIAGNOSIS — R6520 Severe sepsis without septic shock: Secondary | ICD-10-CM | POA: Diagnosis not present

## 2023-06-29 DIAGNOSIS — A419 Sepsis, unspecified organism: Secondary | ICD-10-CM | POA: Diagnosis not present

## 2023-06-29 LAB — BASIC METABOLIC PANEL
Anion gap: 7 (ref 5–15)
BUN: 16 mg/dL (ref 8–23)
CO2: 22 mmol/L (ref 22–32)
Calcium: 9.3 mg/dL (ref 8.9–10.3)
Chloride: 107 mmol/L (ref 98–111)
Creatinine, Ser: 1.32 mg/dL — ABNORMAL HIGH (ref 0.61–1.24)
GFR, Estimated: 60 mL/min — ABNORMAL LOW (ref >60)
Glucose, Bld: 160 mg/dL — ABNORMAL HIGH (ref 70–99)
Potassium: 3.5 mmol/L (ref 3.5–5.1)
Sodium: 136 mmol/L (ref 135–145)

## 2023-06-29 LAB — CBC
HCT: 36.3 % — ABNORMAL LOW (ref 39.0–52.0)
Hemoglobin: 11.6 g/dL — ABNORMAL LOW (ref 13.0–17.0)
MCH: 28.8 pg (ref 26.0–34.0)
MCHC: 32 g/dL (ref 30.0–36.0)
MCV: 90.1 fL (ref 80.0–100.0)
Platelets: 163 10*3/uL (ref 150–400)
RBC: 4.03 MIL/uL — ABNORMAL LOW (ref 4.22–5.81)
RDW: 13.7 % (ref 11.5–15.5)
WBC: 9.1 10*3/uL (ref 4.0–10.5)
nRBC: 0 % (ref 0.0–0.2)

## 2023-06-29 LAB — GLUCOSE, CAPILLARY
Glucose-Capillary: 144 mg/dL — ABNORMAL HIGH (ref 70–99)
Glucose-Capillary: 255 mg/dL — ABNORMAL HIGH (ref 70–99)

## 2023-06-29 LAB — HIV ANTIBODY (ROUTINE TESTING W REFLEX): HIV Screen 4th Generation wRfx: NONREACTIVE

## 2023-06-29 MED ORDER — BENZONATATE 100 MG PO CAPS
200.0000 mg | ORAL_CAPSULE | Freq: Three times a day (TID) | ORAL | 0 refills | Status: AC | PRN
  Filled 2023-06-29: qty 40, 7d supply, fill #0

## 2023-06-29 MED ORDER — NOVOLOG FLEXPEN 100 UNIT/ML ~~LOC~~ SOPN: 12.0000 [IU] | PEN_INJECTOR | Freq: Three times a day (TID) | SUBCUTANEOUS | Status: AC

## 2023-06-29 MED ORDER — DOXYCYCLINE HYCLATE 100 MG PO TABS
100.0000 mg | ORAL_TABLET | Freq: Two times a day (BID) | ORAL | Status: DC
  Administered 2023-06-29: 100 mg via ORAL
  Filled 2023-06-29: qty 1

## 2023-06-29 MED ORDER — DOXYCYCLINE HYCLATE 100 MG PO TABS
100.0000 mg | ORAL_TABLET | Freq: Two times a day (BID) | ORAL | 0 refills | Status: AC
  Filled 2023-06-29: qty 8, 4d supply, fill #0

## 2023-06-29 MED ORDER — ACETAMINOPHEN 325 MG PO TABS: 650.0000 mg | ORAL_TABLET | Freq: Four times a day (QID) | ORAL | Status: AC | PRN

## 2023-06-29 NOTE — Care Management CC44 (Signed)
Condition Code 44 Documentation Completed  Patient Details  Name: Charles [name] MRN: [id number] Date of Birth: 1955/11/14   Condition Code 44 given:  Yes Patient signature on Condition Code 44 notice:  Yes Documentation of 2 MD's agreement:  Yes Code 44 added to claim:  Yes    Tom-Johnson, Hershal Coria, RN 06/29/2023, 11:28 AM

## 2023-06-29 NOTE — Progress Notes (Signed)
Charles [name] to be discharged Home per MD order. Discussed prescriptions and follow up appointments with the patient. Prescriptions and medication list explained in detail. Patient verbalized understanding.  Skin clean, dry and intact without evidence of skin break down, no evidence of skin tears noted. IV catheter discontinued intact. Site without signs and symptoms of complications. Dressing and pressure applied. Pt denies pain at the site currently. No complaints noted.  Patient free of lines, drains, and wounds.   An After Visit Summary (AVS) was printed and given to the patient. Patient escorted via wheelchair, and discharged home via private auto.  Arvilla Meres, RN

## 2023-06-29 NOTE — Plan of Care (Signed)
  Problem: Health Behavior/Discharge Planning: Goal: Ability to manage health-related needs will improve Outcome: Completed/Met

## 2023-06-29 NOTE — Discharge Summary (Signed)
Physician Discharge Summary  Charles [name] ZDG:[id number] DOB: 05-06-1956 DOA: 06/28/2023  PCP: Charlane Ferretti, DO  Admit date: 06/28/2023 Discharge date: 06/29/2023  Admitted From: home Discharge disposition: home   Recommendations for Outpatient Follow-Up:   Follow x ray to ensure pna resolution   Discharge Diagnosis:   Principal Problem:   Severe sepsis (HCC) Active Problems:   AKI (acute kidney injury) (HCC)   CAP (community acquired pneumonia)    Discharge Condition: Improved.  Diet recommendation: Carbohydrate-modified.   Wound care: None.  Code status: Full.   History of Present Illness:   Charles [name] is a 67 y.o. male with medical history significant of  Type II DM, HTN, HLD, anemia, ESRD s/p kidney transplant, secondary hyperparathyroidism, anemia of chronic disease, GERD, and latent TB.  He presents to [facility] emergency department with a 3-day history of headache, nasal congestion, bilateral otalgia.  He denies fevers, fatigue, malaise, abdominal pain, nausea, vomiting, or diarrhea.  He denies vision changes, dizziness, photophobia, phonophobia. He denies chest pain, shortness of breath.  He has not had had any known sick contacts.    Hospital Course by Problem:   #Severe Sepsis #Community Acquired Pneumonia CXR with bilateral interstitial prominence with airspace opacities in the (R) upper and lower lobes. SIRs criteria: RR 21, WBC 13.4 Lactic Acidosis: 2.4 - Abx : Rocephin and Azithromycin-- change to PO abx -improved faster than anticipated feeling much better and asking to go home  #Acute Kidney Injury #s/p DDKT Transplant Creatinine 1.63, last available documented Creatinine 1.44 in August 2022. See [facility] note in media tab - -outpatient follow up     #Diabetes Mellitus, Type II - resume home meds   #Hyperlipidemia -Continue home pravastatin   #Hypertension - resume home meds   #Secondary  hyperparathyroidism -Continue home calcitriol    Medical Consultants:      Discharge Exam:   Vitals:   06/29/23 0434 06/29/23 0717  BP: (!) 166/71 (!) 159/70  Pulse: 69 72  Resp:  18  Temp: 98.8 F (37.1 C) 98.6 F (37 C)  SpO2: 99% 99%   Vitals:   06/28/23 1630 06/28/23 1946 06/29/23 0434 06/29/23 0717  BP: (!) 151/88 (!) 145/61 (!) 166/71 (!) 159/70  Pulse: 73 78 69 72  Resp:    18  Temp: 99.1 F (37.3 C) 98.5 F (36.9 C) 98.8 F (37.1 C) 98.6 F (37 C)  TempSrc: Oral Oral Oral Oral  SpO2: 98% 95% 99% 99%    General exam: Appears calm and comfortable.   The results of significant diagnostics from this hospitalization (including imaging, microbiology, ancillary and laboratory) are listed below for reference.     Procedures and Diagnostic Studies:   DG Chest Portable 1 View  Result Date: 06/28/2023 CLINICAL DATA:  Shortness of breath EXAM: PORTABLE CHEST 1 VIEW COMPARISON:  05/22/2022 FINDINGS: The heart size and mediastinal contours are within normal limits. Bilateral interstitial prominence with airspace opacities in the right upper and right lower lobes. Airspace disease is most confluent within the inferior aspect of the right upper lobe. No pleural effusion or pneumothorax. The visualized skeletal structures are unremarkable. IMPRESSION: Bilateral interstitial prominence with airspace opacities in the right upper and right lower lobes, suggestive of multifocal pneumonia. Electronically Signed   By: Duanne Guess D.O.   On: 06/28/2023 10:40     Labs:   Basic Metabolic Panel: Recent Labs  Lab 06/28/23 1013 06/29/23 0653  NA 138 136  K  3.8 3.5  CL 106 107  CO2 24 22  GLUCOSE 224* 160*  BUN 21 16  CREATININE 1.63* 1.32*  CALCIUM 9.6 9.3   GFR CrCl cannot be calculated (Unknown ideal weight.). Liver Function Tests: No results for input(s): "AST", "ALT", "ALKPHOS", "BILITOT", "PROT", "ALBUMIN" in the last 168 hours. No results for input(s):  "LIPASE", "AMYLASE" in the last 168 hours. No results for input(s): "AMMONIA" in the last 168 hours. Coagulation profile No results for input(s): "INR", "PROTIME" in the last 168 hours.  CBC: Recent Labs  Lab 06/28/23 1013 06/29/23 0653  WBC 13.4* 9.1  NEUTROABS 11.3*  --   HGB 12.3* 11.6*  HCT 39.5 36.3*  MCV 93.2 90.1  PLT 182 163   Cardiac Enzymes: No results for input(s): "CKTOTAL", "CKMB", "CKMBINDEX", "TROPONINI" in the last 168 hours. BNP: Invalid input(s): "POCBNP" CBG: Recent Labs  Lab 06/28/23 1327 06/28/23 1633 06/28/23 2117 06/29/23 0717  GLUCAP 194* 231* 294* 144*   D-Dimer No results for input(s): "DDIMER" in the last 72 hours. Hgb A1c Recent Labs    06/28/23 1404  HGBA1C 7.2*   Lipid Profile No results for input(s): "CHOL", "HDL", "LDLCALC", "TRIG", "CHOLHDL", "LDLDIRECT" in the last 72 hours. Thyroid function studies No results for input(s): "TSH", "T4TOTAL", "T3FREE", "THYROIDAB" in the last 72 hours.  Invalid input(s): "FREET3" Anemia work up No results for input(s): "VITAMINB12", "FOLATE", "FERRITIN", "TIBC", "IRON", "RETICCTPCT" in the last 72 hours. Microbiology Recent Results (from the past 240 hour(s))  Resp panel by RT-PCR (RSV, Flu A&B, Covid) Anterior Nasal Swab     Status: None   Collection Time: 06/28/23  9:55 AM   Specimen: Anterior Nasal Swab  Result Value Ref Range Status   SARS Coronavirus 2 by RT PCR NEGATIVE NEGATIVE Final   Influenza A by PCR NEGATIVE NEGATIVE Final   Influenza B by PCR NEGATIVE NEGATIVE Final    Comment: (NOTE) The Xpert Xpress SARS-CoV-2/FLU/RSV plus assay is intended as an aid in the diagnosis of influenza from Nasopharyngeal swab specimens and should not be used as a sole basis for treatment. Nasal washings and aspirates are unacceptable for Xpert Xpress SARS-CoV-2/FLU/RSV testing.  Fact Sheet for Patients: BloggerCourse.com  Fact Sheet for Healthcare  Providers: SeriousBroker.it  This test is not yet approved or cleared by the [place] FDA and has been authorized for detection and/or diagnosis of SARS-CoV-2 by FDA under an Emergency Use Authorization (EUA). This EUA will remain in effect (meaning this test can be used) for the duration of the COVID-19 declaration under Section 564(b)(1) of the Act, 21 U.S.C. section 360bbb-3(b)(1), unless the authorization is terminated or revoked.     Resp Syncytial Virus by PCR NEGATIVE NEGATIVE Final    Comment: (NOTE) Fact Sheet for Patients: BloggerCourse.com  Fact Sheet for Healthcare Providers: SeriousBroker.it  This test is not yet approved or cleared by the [place] FDA and has been authorized for detection and/or diagnosis of SARS-CoV-2 by FDA under an Emergency Use Authorization (EUA). This EUA will remain in effect (meaning this test can be used) for the duration of the COVID-19 declaration under Section 564(b)(1) of the Act, 21 U.S.C. section 360bbb-3(b)(1), unless the authorization is terminated or revoked.  Performed at [facility], [address], [place], [place] [address]   Group A Strep by PCR     Status: None   Collection Time: 06/28/23 10:13 AM   Specimen: Anterior Nasal Swab; Sterile Swab  Result Value Ref Range Status   Group A  Strep by PCR NOT DETECTED NOT DETECTED Final    Comment: Performed at [facility], [address] [place], [place] [address]  Blood culture (routine x 2)     Status: None (Preliminary result)   Collection Time: 06/28/23 10:46 AM   Specimen: BLOOD LEFT ARM  Result Value Ref Range Status   Specimen Description BLOOD LEFT ARM  Final   Special Requests   Final    BOTTLES DRAWN AEROBIC AND ANAEROBIC Blood Culture results may not be optimal due to an inadequate volume of blood received in culture bottles   Culture   Final    NO GROWTH < 24  HOURS Performed at [facility], [address], [place], [place] [address]    Report Status PENDING  Incomplete  Blood culture (routine x 2)     Status: None (Preliminary result)   Collection Time: 06/28/23 11:47 AM   Specimen: BLOOD LEFT HAND  Result Value Ref Range Status   Specimen Description BLOOD LEFT HAND  Final   Special Requests   Final    BOTTLES DRAWN AEROBIC ONLY Blood Culture results may not be optimal due to an inadequate volume of blood received in culture bottles   Culture   Final    NO GROWTH < 24 HOURS Performed at [facility], [address] [place], [place] [address]    Report Status PENDING  Incomplete  Respiratory (~20 pathogens) panel by PCR     Status: None   Collection Time: 06/28/23 12:03 PM   Specimen: Nasopharyngeal Swab; Respiratory  Result Value Ref Range Status   Adenovirus NOT DETECTED NOT DETECTED Final   Coronavirus 229E NOT DETECTED NOT DETECTED Final    Comment: (NOTE) The Coronavirus on the Respiratory Panel, DOES NOT test for the novel  Coronavirus (2019 nCoV)    Coronavirus HKU1 NOT DETECTED NOT DETECTED Final   Coronavirus NL63 NOT DETECTED NOT DETECTED Final   Coronavirus OC43 NOT DETECTED NOT DETECTED Final   Metapneumovirus NOT DETECTED NOT DETECTED Final   Rhinovirus / Enterovirus NOT DETECTED NOT DETECTED Final   Influenza A NOT DETECTED NOT DETECTED Final   Influenza B NOT DETECTED NOT DETECTED Final   Parainfluenza Virus 1 NOT DETECTED NOT DETECTED Final   Parainfluenza Virus 2 NOT DETECTED NOT DETECTED Final   Parainfluenza Virus 3 NOT DETECTED NOT DETECTED Final   Parainfluenza Virus 4 NOT DETECTED NOT DETECTED Final   Respiratory Syncytial Virus NOT DETECTED NOT DETECTED Final   Bordetella pertussis NOT DETECTED NOT DETECTED Final   Bordetella Parapertussis NOT DETECTED NOT DETECTED Final   Chlamydophila pneumoniae NOT DETECTED NOT DETECTED Final   Mycoplasma pneumoniae NOT DETECTED NOT DETECTED Final     Comment: Performed at [facility], [address], [place], [place] [address]  MRSA Next Gen by PCR, Nasal     Status: None   Collection Time: 06/28/23 12:41 PM   Specimen: Nasopharyngeal Swab; Nasal Swab  Result Value Ref Range Status   MRSA by PCR Next Gen NOT DETECTED NOT DETECTED Final    Comment: (NOTE) The GeneXpert MRSA Assay (FDA approved for NASAL specimens only), is one component of a comprehensive MRSA colonization surveillance program. It is not intended to diagnose MRSA infection nor to guide or monitor treatment for MRSA infections. Test performance is not FDA approved in patients less than 32 years old. Performed at [facility], [address], [place], [place] [address]  Discharge Instructions:    Allergies as of 06/29/2023   No Known Allergies      Medication List     TAKE these medications    acetaminophen 325 MG tablet Commonly known as: TYLENOL Take 2 tablets (650 mg total) by mouth every 6 (six) hours as needed for mild pain (pain score 1-3), fever or headache.   amLODipine 10 MG tablet Commonly known as: NORVASC Take 10 mg by mouth daily.   b complex-vitamin c-folic acid 0.8 MG Tabs tablet Take 1 tablet by mouth daily.   benzonatate 200 MG capsule Commonly known as: TESSALON Take 1 capsule (200 mg total) by mouth 3 (three) times daily as needed for cough.   calcitRIOL 0.5 MCG capsule Commonly known as: ROCALTROL Take 0.5 mcg by mouth daily.   doxycycline 100 MG tablet Commonly known as: VIBRA-TABS Take 1 tablet (100 mg total) by mouth every 12 (twelve) hours.   furosemide 80 MG tablet Commonly known as: LASIX Take 80 mg by mouth daily.   hydrALAZINE 50 MG tablet Commonly known as: APRESOLINE Take 50-100 mg by mouth See admin instructions. 50 mg in the morning, 100 mg in the evening   Lantus SoloStar 100 UNIT/ML Solostar Pen Generic drug: insulin glargine Inject 25 Units into the skin daily. What changed: when to  take this   metoprolol tartrate 100 MG tablet Commonly known as: LOPRESSOR Take 100 mg by mouth 2 (two) times daily. Take 1 table PO BID   mycophenolate 180 MG EC tablet Commonly known as: MYFORTIC Take 540 mg by mouth 2 (two) times daily.   NovoLOG FlexPen 100 UNIT/ML FlexPen Generic drug: insulin aspart Inject 12 Units into the skin 3 (three) times daily with meals.   olmesartan 20 MG tablet Commonly known as: BENICAR Take 20 mg by mouth daily.   pravastatin 40 MG tablet Commonly known as: PRAVACHOL Take 40 mg by mouth daily.   predniSONE 5 MG tablet Commonly known as: DELTASONE Take 5 mg by mouth daily with breakfast. Take 1 tablet PO daily   tacrolimus 1 MG capsule Commonly known as: PROGRAF Take 2 mg by mouth 2 (two) times daily.   tacrolimus 5 MG capsule Commonly known as: PROGRAF Take 5 mg by mouth 2 (two) times daily.          Time coordinating discharge: 45 min  Signed:  Joseph Art DO  [facility] 06/29/2023, 11:04 AM

## 2023-06-29 NOTE — Care Management Obs Status (Signed)
MEDICARE OBSERVATION STATUS NOTIFICATION   Patient Details  Name: OMARR SARMA MRN: [id number] Date of Birth: Jul 22, 1956   Medicare Observation Status Notification Given:  Yes    Tom-Johnson, Hershal Coria, RN 06/29/2023, 11:27 AM

## 2023-06-29 NOTE — TOC Transition Note (Signed)
Transition of Care [facility]) - CM/SW Discharge Note   Patient Details  Name: Charles [name] MRN: [id number] Date of Birth: Jan 24, 1956  Transition of Care [facility]) CM/SW Contact:  Tom-Johnson, Hershal Coria, RN Phone Number: 06/29/2023, 11:39 AM   Clinical Narrative:     Patient is scheduled for discharge today.  Readmission Risk Assessment done. Hospital f/u and discharge instructions on AVS. Prescriptions sent to [facility] pharmacy and patient will receive meds prior discharge. No TOC needs or recommendations noted. Daughter, Tobi Bastos at bedside and will transport at discharge.  No further TOC needs noted.          Final next level of care: Home/Self Care Barriers to Discharge: Barriers Resolved   Patient Goals and CMS Choice CMS Medicare.gov Compare Post Acute Care list provided to:: Patient Choice offered to / list presented to : Patient  Discharge Placement                  Patient to be transferred to facility by: Daughter Name of family member notified: [facility]    Discharge Plan and Services Additional resources added to the After Visit Summary for                  DME Arranged: N/A DME Agency: NA       HH Arranged: NA HH Agency: NA        Social Determinants of Health (SDOH) Interventions SDOH Screenings   Food Insecurity: Food Insecurity Present (06/28/2023)  Housing: Low Risk  (06/28/2023)  Transportation Needs: No Transportation Needs (06/28/2023)  Utilities: At Risk (06/28/2023)  Social Connections: Unknown (12/09/2021)   [facility] from [facility]  Tobacco Use: Low Risk  (06/28/2023)     Readmission Risk Interventions    06/29/2023   11:38 AM  Readmission Risk Prevention Plan  Post Dischage Appt Complete  Medication Screening Complete  Transportation Screening Complete

## 2023-07-03 LAB — CULTURE, BLOOD (ROUTINE X 2)
Culture: NO GROWTH
Culture: NO GROWTH

## 2024-05-01 ENCOUNTER — Other Ambulatory Visit (HOSPITAL_BASED_OUTPATIENT_CLINIC_OR_DEPARTMENT_OTHER): Payer: Self-pay | Admitting: Internal Medicine

## 2024-05-01 DIAGNOSIS — R911 Solitary pulmonary nodule: Secondary | ICD-10-CM

## 2024-05-08 ENCOUNTER — Ambulatory Visit (HOSPITAL_BASED_OUTPATIENT_CLINIC_OR_DEPARTMENT_OTHER)
Admission: RE | Admit: 2024-05-08 | Discharge: 2024-05-08 | Disposition: A | Discharge status: Home / Self Care | Source: Ambulatory Visit | Attending: Internal Medicine | Admitting: Internal Medicine

## 2024-05-08 DIAGNOSIS — R911 Solitary pulmonary nodule: Secondary | ICD-10-CM | POA: Diagnosis present
